# Patient Record
Sex: Male | Born: 1970 | Race: White | Hispanic: No | Marital: Married | Smoking: Former smoker
Health system: Southern US, Community
[De-identification: ages and names within clinical notes are randomized; demographics above are authoritative.]

## PROBLEM LIST (undated history)

## (undated) DIAGNOSIS — D126 Benign neoplasm of colon, unspecified: Secondary | ICD-10-CM

## (undated) DIAGNOSIS — R112 Nausea with vomiting, unspecified: Secondary | ICD-10-CM

## (undated) DIAGNOSIS — M5430 Sciatica, unspecified side: Secondary | ICD-10-CM

## (undated) DIAGNOSIS — K219 Gastro-esophageal reflux disease without esophagitis: Secondary | ICD-10-CM

## (undated) DIAGNOSIS — T8859XA Other complications of anesthesia, initial encounter: Secondary | ICD-10-CM

## (undated) DIAGNOSIS — J189 Pneumonia, unspecified organism: Secondary | ICD-10-CM

## (undated) DIAGNOSIS — Z9889 Other specified postprocedural states: Secondary | ICD-10-CM

## (undated) DIAGNOSIS — J45909 Unspecified asthma, uncomplicated: Secondary | ICD-10-CM

## (undated) DIAGNOSIS — A770 Spotted fever due to Rickettsia rickettsii: Secondary | ICD-10-CM

## (undated) DIAGNOSIS — T4145XA Adverse effect of unspecified anesthetic, initial encounter: Secondary | ICD-10-CM

## (undated) HISTORY — DX: Gastro-esophageal reflux disease without esophagitis: K21.9

## (undated) HISTORY — DX: Benign neoplasm of colon, unspecified: D12.6

## (undated) HISTORY — DX: Spotted fever due to Rickettsia rickettsii: A77.0

## (undated) HISTORY — DX: Sciatica, unspecified side: M54.30

---
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
## 2006-10-24 DIAGNOSIS — A770 Spotted fever due to Rickettsia rickettsii: Secondary | ICD-10-CM

## 2006-10-24 HISTORY — DX: Spotted fever due to Rickettsia rickettsii: A77.0

## 2008-03-21 ENCOUNTER — Ambulatory Visit (HOSPITAL_COMMUNITY): Admission: RE | Admit: 2008-03-21 | Discharge: 2008-03-21 | Payer: Self-pay | Admitting: Nurse Practitioner

## 2011-12-21 ENCOUNTER — Ambulatory Visit: Payer: Self-pay | Admitting: Nurse Practitioner

## 2012-07-19 ENCOUNTER — Emergency Department: Payer: Self-pay | Admitting: Emergency Medicine

## 2012-07-19 LAB — COMPREHENSIVE METABOLIC PANEL
AST: 20 U/L
Albumin: 3.9
Alkaline Phosphatase: 59 U/L (ref 50–136)
Anion gap: 10
BUN: 10 mg/dL (ref 7–18)
Bilirubin,Total: 0.9 mg/dL (ref 0.2–1.0)
CO2: 25 mmol/L
Calcium: 9.5 mg/dL
Chloride: 108 mmol/L — ABNORMAL HIGH (ref 98–107)
Chloride: 108 mmol/L
Co2: 25 mmol/L (ref 21–32)
Creatinine: 0.95 mg/dL (ref 0.60–1.30)
EGFR (Non-African Amer.): >60
Lipase: 211 units/L — AB (ref 0–53)
Osmolality: 284
Potassium: 4 mmol/L
SGOT(AST): 20 U/L (ref 15–37)
SGPT (ALT): 36 U/L (ref 12–78)
Sodium: 143 mmol/L (ref 137–147)

## 2012-07-19 LAB — CBC WITH DIFFERENTIAL/PLATELET
HCT: 46 %
Hemoglobin: 15.6 g/dL (ref 13.5–17.5)
MCH: 30.3
MCHC: 33.9
MCV: 90 fL
RBC: 5.15

## 2012-07-19 LAB — URINALYSIS, COMPLETE
Blood: NEGATIVE
Leukocyte Esterase: NEGATIVE
Nitrite: NEGATIVE
Ph: 5 (ref 4.5–8.0)
Protein: NEGATIVE

## 2012-07-19 LAB — CBC
HCT: 46.1 % (ref 40.0–52.0)
MCHC: 33.9 g/dL (ref 32.0–36.0)
Platelet: 380 10*3/uL (ref 150–440)
RDW: 13.3 % (ref 11.5–14.5)
WBC: 8.7 10*3/uL (ref 3.8–10.6)

## 2012-07-19 LAB — LIPASE, BLOOD: Lipase: 211 U/L (ref 73–393)

## 2012-07-24 ENCOUNTER — Encounter (HOSPITAL_COMMUNITY): Payer: Self-pay

## 2012-07-24 ENCOUNTER — Encounter: Payer: Self-pay | Admitting: Internal Medicine

## 2012-07-24 ENCOUNTER — Telehealth: Payer: Self-pay | Admitting: Internal Medicine

## 2012-07-24 ENCOUNTER — Ambulatory Visit (HOSPITAL_COMMUNITY)
Admission: RE | Admit: 2012-07-24 | Discharge: 2012-07-24 | Disposition: A | Discharge status: Home / Self Care | Payer: BC Managed Care – PPO | Source: Ambulatory Visit | Attending: Urgent Care | Admitting: Urgent Care

## 2012-07-24 ENCOUNTER — Ambulatory Visit (INDEPENDENT_AMBULATORY_CARE_PROVIDER_SITE_OTHER): Payer: BC Managed Care – PPO | Admitting: Urgent Care

## 2012-07-24 ENCOUNTER — Encounter: Payer: Self-pay | Admitting: Urgent Care

## 2012-07-24 ENCOUNTER — Other Ambulatory Visit: Payer: Self-pay | Admitting: Internal Medicine

## 2012-07-24 VITALS — BP 120/74 | HR 64 | Temp 97.5°F | Ht 74.0 in | Wt 254.2 lb

## 2012-07-24 DIAGNOSIS — R109 Unspecified abdominal pain: Secondary | ICD-10-CM | POA: Insufficient documentation

## 2012-07-24 DIAGNOSIS — K219 Gastro-esophageal reflux disease without esophagitis: Secondary | ICD-10-CM | POA: Insufficient documentation

## 2012-07-24 DIAGNOSIS — R197 Diarrhea, unspecified: Secondary | ICD-10-CM

## 2012-07-24 DIAGNOSIS — R933 Abnormal findings on diagnostic imaging of other parts of digestive tract: Secondary | ICD-10-CM | POA: Insufficient documentation

## 2012-07-24 DIAGNOSIS — R1010 Upper abdominal pain, unspecified: Secondary | ICD-10-CM

## 2012-07-24 MED ORDER — IOHEXOL 300 MG/ML  SOLN
100.0000 mL | Freq: Once | INTRAMUSCULAR | Status: AC | PRN
  Administered 2012-07-24: 100 mL via INTRAVENOUS

## 2012-07-24 MED ORDER — PEG 3350-KCL-NA BICARB-NACL 420 G PO SOLR: 4000.0000 mL | ORAL | Status: DC

## 2012-07-24 NOTE — Progress Notes (Signed)
Faxed to PCP

## 2012-07-24 NOTE — Telephone Encounter (Signed)
Pt called to let us know he has just left [facility] from having his CT done and was told not to eat or drink anything until he hears back from [place]. His cell phone ringer is broken and he said he would call back at 4 to see if Doristine Church has heard from the radiologist. Pt was advised to stay in [place] area for the next hour or so until he got further instructions.

## 2012-07-24 NOTE — Telephone Encounter (Signed)
Route to [employer]

## 2012-07-24 NOTE — Progress Notes (Signed)
Pt was given stool containers and all lab orders when he came to the office.

## 2012-07-24 NOTE — Addendum Note (Signed)
Addended by: Joselyn Arrow on: 07/24/2012 04:11 PM   Modules accepted: Orders

## 2012-07-24 NOTE — Progress Notes (Signed)
Referring Provider: No ref. provider found Primary Care Physician:  PATTERSON, [name], NP Primary Gastroenterologist:  Dr. Rourk  Chief Complaint  Patient presents with  . Abdominal Pain    x 3.5 weeks.    HPI:  Shawn Brandt is a 41 y.o. male here as a referral from [name] Patterson, NP for abdominal pain.  Approximately 4 weeks ago, pt began to have severe abdominal pain that started in upper abdomen.  Later pain moved to LLQ.  He has daily discomfort when sitting.  Pain improves with belching or flatus.  He went to ER at [facility] & was sent home with relief after GI cocktail.   He recalls intermittent episodes of abdominal pain over past 2yrs.  The pain radiates to his back between his shoulder blades.  He describes it as sharp.  Usually occurs at 1am.  Pain always occurs at night after 10:00. It has been waking him from sleeping. Episodes last around 3-1/2 hours. Pain 8/10 at worst.  Pain 2/10 now.  Pain worse after 5-10 min after eating.  Denies vomiting.  C/o heartburn & indigestion.  He has been taking Omeprazole 20mg daily intermittently for the last 2 yrs.  He admits to stopping when he was feeling better.  He has been taking Omeprazole 20mg on a regular basis for the past 3 months.  Denies dysphagia or odynophagia.  Wt stable. He describes a lifelong history of "loose stools" approximately 3-4 daily, however the last several weeks he has been having 5-6 stools daily. Denies rectal bleeding, melena, or mucus in his stools.   9/26 ER Labs reviewed:  Hemoglobin,  WBC, platelets normal. CMP normal except chloride 108. Lipase normal. Urinalysis shows mucous, otherwise normal. Abdominal ultrasound 12/21/11 normal  Past Medical History  Diagnosis Date  . GERD (gastroesophageal reflux disease)   . 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  Current Outpatient Prescriptions  Medication Sig Dispense Refill  . calcium carbonate (TUMS - DOSED IN MG  ELEMENTAL CALCIUM) 500 MG chewable tablet Chew 1 tablet by mouth as needed.      . omeprazole (PRILOSEC) 20 MG capsule Take 20 mg by mouth daily.        Allergies as of 07/24/2012 - Review Complete 07/24/2012  Allergen Reaction Noted  . Peanuts (peanut oil)  07/24/2012  . Penicillins  07/24/2012    Family History:There is no known family history of colorectal carcinoma , liver disease, or inflammatory bowel disease.  Problem Relation Age of Onset  . Cholelithiasis Mother   . Cancer Maternal Grandfather     ?  . Breast cancer Mother   . Prostate cancer Father     History   Social History  . Marital Status: Married    Spouse Name: N/A    Number of Children: 2  . Years of Education: N/A   Occupational History  . probation officer    Social History Main Topics  . Smoking status: Never Smoker   . Smokeless tobacco: Current User  . Alcohol Use: Yes     3 times per yr, 2 drinks  . Drug Use: No  . Sexually Active: Not on file   Other Topics Concern  . Not on file   Social History Narrative  . No narrative on file   Review of Systems: Gen: Denies any fever, chills, sweats, anorexia, fatigue, weakness, malaise, weight loss, and sleep disorder CV: Denies chest pain, angina, palpitations, syncope, orthopnea, PND,   peripheral edema, and claudication. Resp: Denies dyspnea at rest, dyspnea with exercise, cough, sputum, wheezing, coughing up blood, and pleurisy. GI: Denies vomiting blood, jaundice, and fecal incontinence.   GU : Denies urinary burning, blood in urine, urinary frequency, urinary hesitancy, nocturnal urination, and urinary incontinence. MS: Denies joint pain, limitation of movement, and swelling, stiffness, low back pain, extremity pain. Denies muscle weakness, cramps, atrophy.  Derm: Denies rash, itching, dry skin, hives, moles, warts, or unhealing ulcers.  Psych: Denies depression, anxiety, memory loss, suicidal ideation, hallucinations, paranoia, and  confusion. Heme: Denies bruising, bleeding, and enlarged lymph nodes. Neuro:  Denies any headaches, dizziness, paresthesias. Endo:  Denies any problems with DM, thyroid, adrenal function.  Physical Exam: BP 120/74  Pulse 64  Temp 97.5 F (36.4 C) (Temporal)  Ht 6' 2" (1.88 m)  Wt 254 lb 3.2 oz (115.304 kg)  BMI 32.64 kg/m2 No LMP for male patient. General:   Alert,  Well-developed, obese, pleasant and cooperative in NAD Head:  Normocephalic and atraumatic. Eyes:  Sclera clear, no icterus.   Conjunctiva pink. Ears:  Normal auditory acuity. Nose:  No deformity, discharge, or lesions. Mouth:  No deformity or lesions,oropharynx pink & moist. Neck:  Supple; no masses or thyromegaly. Lungs:  Clear throughout to auscultation.   No wheezes, crackles, or rhonchi. No acute distress. Heart:  Regular rate and rhythm; no murmurs, clicks, rubs,  or gallops. Abdomen:  Protuberant. Normal bowel sounds.  No bruits.  Soft, non-distended.  Moderate tenderness to palpation of upper abdomen as well around umbilicus and left lower quadrant. No masses, hepatosplenomegaly or hernias noted.  No guarding or rebound tenderness.   Rectal:  Deferred. Msk:  Symmetrical without gross deformities. Normal posture. Pulses:  Normal pulses noted. Extremities:  No clubbing or edema. Neurologic:  Alert and oriented x4;  grossly normal neurologically. Skin:  Intact without significant lesions or rashes. Lymph Nodes:  No significant cervical adenopathy. Psych:  Alert and cooperative. Normal mood and affect.  

## 2012-07-24 NOTE — Assessment & Plan Note (Addendum)
Shawn Brandt is a pleasant 41 y.o. male with severe abdominal pain for the past 3-4 weeks. The majority of his pain his upper abdomen & it does radiate to his back between his shoulder blades as well as left lower quadrant.  Ultrasound in February of abdomen was normal.  Labs from recent ER visit were normal. Differentials are broad & include poorly controlled GERD, gastritis, peptic ulcer disease, pancreatitis, cholecystitis, diverticulitis, AAA or colitis.  STAT CT Abd/pelvis w/ IV/oral contrast  To ER if severe pain

## 2012-07-24 NOTE — Patient Instructions (Addendum)
We will call you with CT results We have requested your records from [facility] ER Stop Omeprazole Start Dexilant 60mg  daily  To ER if severe pain

## 2012-07-24 NOTE — Telephone Encounter (Signed)
noted 

## 2012-07-24 NOTE — Progress Notes (Signed)
Ct shows nonspecific minimal thickening of the distal terminal little wall and nonspecific mild thickening of the urinary bladder wall. Results reviewed with patient. Full set of stool studies, urinalysis with reflex culture. Supportive measures including increased fluids discussed with patient. Colonoscopy and EGD with Dr. Jena Gauss reason : Diarrhea, thickened TI on CT, upper abdominal pain, GERD Patient planning on stopping by to pick up lab orders, containers, and schedule procedures today

## 2012-07-24 NOTE — Assessment & Plan Note (Signed)
Intermittent GERD, now with chronic daily symptoms & dyspepsia.    Stop Omeprazole Start Dexilant 60mg  daily  Consider EGD w/ Dr Jena Gauss if CT does not explain pain.  I have discussed risks & benefits which include, but are not limited to, bleeding, infection, perforation & drug reaction.  The patient agrees with this plan & written consent will be obtained.

## 2012-07-24 NOTE — Progress Notes (Signed)
Quick Note:  See addendum to OV. Pt will have stools, UA, TCS/EGD w/ Dr Jena Gauss. YN:[id number], [name], NP  ______

## 2012-07-24 NOTE — Assessment & Plan Note (Signed)
Chronic loose stools as baseline w/ recent increased frequency 5-6 nonbloody stools per day.  Differentials include colitis, IBD, or IBS.

## 2012-07-24 NOTE — Progress Notes (Signed)
Patient is scheduled for TCS and EGD with RMR on Oct 17th and he was given instructions

## 2012-07-25 LAB — URINALYSIS, ROUTINE W REFLEX MICROSCOPIC
Bilirubin Urine: NEGATIVE
Glucose, UA: NEGATIVE mg/dL
Hgb urine dipstick: NEGATIVE
Ketones, ur: NEGATIVE mg/dL
Nitrite: NEGATIVE
Specific Gravity, Urine: 1.02 (ref 1.005–1.030)
pH: 6 (ref 5.0–8.0)

## 2012-07-25 NOTE — Progress Notes (Signed)
Quick Note:  Await stools WU:[id number], [name], NP  ______

## 2012-07-25 NOTE — Progress Notes (Signed)
Faxed to PCP

## 2012-07-26 LAB — CLOSTRIDIUM DIFFICILE BY PCR: Toxigenic C. Difficile by PCR: NOT DETECTED

## 2012-07-26 LAB — GIARDIA/CRYPTOSPORIDIUM (EIA)
Cryptosporidium Screen (EIA): NEGATIVE
Giardia Screen (EIA): NEGATIVE

## 2012-07-26 NOTE — Progress Notes (Signed)
Quick Note:  Reviewed at OV. LIPASE WNL per [facility] parameters. ______

## 2012-07-27 ENCOUNTER — Telehealth: Payer: Self-pay

## 2012-07-27 NOTE — Telephone Encounter (Signed)
I called Pt back and told him that there was no need for any anti-bx. He said that hopefull the TCS will show something to help him. His pain is all over.

## 2012-07-27 NOTE — Progress Notes (Signed)
Quick Note:  Cc:PATTERSON, [name], NP  ______ 

## 2012-07-27 NOTE — Telephone Encounter (Signed)
Spoke w/ pt. Today is much better than yesterday. C/o intermittent 10 sec "flash pain" & tenderness. Pain 3/10.  C/o lower back pain.  No fever or diarrhea or vomiting. Offered pain meds, pt declined.  States he is sleeping fine.  Does not need. Advised if severe pain over weekend, to ER. Pt advised to FU with Ninfa Linden, NP regarding bladder thickening on CT.  NO UTI on urinalysis. Keep procedures as planned.

## 2012-07-27 NOTE — Telephone Encounter (Signed)
Pt called this morning to see if the results from the stool studies were by. I told him I would look to see ans call him back at [phone]. I am calling [facility] to find out about the stool culture. Please advise.

## 2012-07-29 LAB — STOOL CULTURE

## 2012-07-30 NOTE — Progress Notes (Signed)
Faxed to [name] Patterson 

## 2012-08-06 ENCOUNTER — Encounter (HOSPITAL_COMMUNITY): Payer: Self-pay | Admitting: Pharmacy Technician

## 2012-08-08 MED ORDER — SODIUM CHLORIDE 0.45 % IV SOLN
INTRAVENOUS | Status: DC
  Administered 2012-08-09: 12:00:00 via INTRAVENOUS

## 2012-08-09 ENCOUNTER — Encounter (HOSPITAL_COMMUNITY): Payer: Self-pay | Admitting: *Deleted

## 2012-08-09 ENCOUNTER — Other Ambulatory Visit: Payer: Self-pay

## 2012-08-09 ENCOUNTER — Ambulatory Visit (HOSPITAL_COMMUNITY)
Admission: RE | Admit: 2012-08-09 | Discharge: 2012-08-09 | Disposition: A | Discharge status: Home / Self Care | Payer: BC Managed Care – PPO | Source: Ambulatory Visit | Attending: Internal Medicine | Admitting: Internal Medicine

## 2012-08-09 ENCOUNTER — Encounter (HOSPITAL_COMMUNITY)
Admission: RE | Disposition: A | Discharge status: Home / Self Care | Payer: Self-pay | Source: Ambulatory Visit | Attending: Internal Medicine

## 2012-08-09 DIAGNOSIS — D126 Benign neoplasm of colon, unspecified: Secondary | ICD-10-CM

## 2012-08-09 DIAGNOSIS — R109 Unspecified abdominal pain: Secondary | ICD-10-CM | POA: Insufficient documentation

## 2012-08-09 DIAGNOSIS — R197 Diarrhea, unspecified: Secondary | ICD-10-CM | POA: Insufficient documentation

## 2012-08-09 DIAGNOSIS — R1010 Upper abdominal pain, unspecified: Secondary | ICD-10-CM

## 2012-08-09 DIAGNOSIS — R933 Abnormal findings on diagnostic imaging of other parts of digestive tract: Secondary | ICD-10-CM

## 2012-08-09 DIAGNOSIS — R1013 Epigastric pain: Secondary | ICD-10-CM

## 2012-08-09 DIAGNOSIS — K2100 Gastro-esophageal reflux disease with esophagitis, without bleeding: Secondary | ICD-10-CM | POA: Insufficient documentation

## 2012-08-09 DIAGNOSIS — K219 Gastro-esophageal reflux disease without esophagitis: Secondary | ICD-10-CM

## 2012-08-09 HISTORY — PX: ESOPHAGOGASTRODUODENOSCOPY: SHX1529

## 2012-08-09 HISTORY — PX: COLONOSCOPY: SHX174

## 2012-08-09 SURGERY — COLONOSCOPY WITH ESOPHAGOGASTRODUODENOSCOPY (EGD)
Anesthesia: Moderate Sedation

## 2012-08-09 MED ORDER — MEPERIDINE HCL 100 MG/ML IJ SOLN
INTRAMUSCULAR | Status: DC | PRN
  Administered 2012-08-09: 50 mg via INTRAVENOUS
  Administered 2012-08-09 (×2): 25 mg via INTRAVENOUS
  Administered 2012-08-09: 50 mg via INTRAVENOUS
  Administered 2012-08-09: 25 mg via INTRAVENOUS
  Administered 2012-08-09: 50 mg via INTRAVENOUS

## 2012-08-09 MED ORDER — MEPERIDINE HCL 100 MG/ML IJ SOLN
INTRAMUSCULAR | Status: AC
  Filled 2012-08-09: qty 2

## 2012-08-09 MED ORDER — STERILE WATER FOR IRRIGATION IR SOLN
Status: DC | PRN
  Administered 2012-08-09: 13:00:00

## 2012-08-09 MED ORDER — MIDAZOLAM HCL 5 MG/5ML IJ SOLN
INTRAMUSCULAR | Status: DC | PRN
  Administered 2012-08-09 (×2): 1 mg via INTRAVENOUS
  Administered 2012-08-09: 2 mg via INTRAVENOUS
  Administered 2012-08-09 (×2): 1 mg via INTRAVENOUS
  Administered 2012-08-09 (×2): 2 mg via INTRAVENOUS

## 2012-08-09 MED ORDER — DEXLANSOPRAZOLE 60 MG PO CPDR: 60.0000 mg | DELAYED_RELEASE_CAPSULE | Freq: Every day | ORAL | Status: DC

## 2012-08-09 MED ORDER — MEPERIDINE HCL 100 MG/ML IJ SOLN
INTRAMUSCULAR | Status: AC
  Filled 2012-08-09: qty 1

## 2012-08-09 MED ORDER — BUTAMBEN-TETRACAINE-BENZOCAINE 2-2-14 % EX AERO
INHALATION_SPRAY | CUTANEOUS | Status: DC | PRN
  Administered 2012-08-09: 2 via TOPICAL

## 2012-08-09 MED ORDER — MIDAZOLAM HCL 5 MG/5ML IJ SOLN
INTRAMUSCULAR | Status: AC
  Filled 2012-08-09: qty 10

## 2012-08-09 NOTE — H&P (View-Only) (Signed)
Referring Provider: No ref. provider found Primary Care Physician:  Ninfa Linden, NP Primary Gastroenterologist:  Dr. Jena Gauss  Chief Complaint  Patient presents with  . Abdominal Pain    x 3.5 weeks.    HPI:  Shawn Brandt is a 41 y.o. male here as a referral from Ninfa Linden, NP for abdominal pain.  Approximately 4 weeks ago, pt began to have severe abdominal pain that started in upper abdomen.  Later pain moved to LLQ.  He has daily discomfort when sitting.  Pain improves with belching or flatus.  He went to ER at [facility] & was sent home with relief after GI cocktail.   He recalls intermittent episodes of abdominal pain over past 66yrs.  The pain radiates to his back between his shoulder blades.  He describes it as sharp.  Usually occurs at 1am.  Pain always occurs at night after 10:00. It has been waking him from sleeping. Episodes last around 3-1/2 hours. Pain 8/10 at worst.  Pain 2/10 now.  Pain worse after 5-10 min after eating.  Denies vomiting.  C/o heartburn & indigestion.  He has been taking Omeprazole 20mg  daily intermittently for the last 2 yrs.  He admits to stopping when he was feeling better.  He has been taking Omeprazole 20mg  on a regular basis for the past 3 months.  Denies dysphagia or odynophagia.  Wt stable. He describes a lifelong history of "loose stools" approximately 3-4 daily, however the last several weeks he has been having 5-6 stools daily. Denies rectal bleeding, melena, or mucus in his stools.   9/26 ER Labs reviewed:  Hemoglobin,  WBC, platelets normal. CMP normal except chloride 108. Lipase normal. Urinalysis shows mucous, otherwise normal. Abdominal ultrasound 12/21/11 normal  Past Medical History  Diagnosis Date  . GERD (gastroesophageal reflux disease)   . RMSF [facility] spotted fever) 2008    No past surgical history on file.  Current Outpatient Prescriptions  Medication Sig Dispense Refill  . calcium carbonate (TUMS - DOSED IN MG  ELEMENTAL CALCIUM) 500 MG chewable tablet Chew 1 tablet by mouth as needed.      Marland Kitchen omeprazole (PRILOSEC) 20 MG capsule Take 20 mg by mouth daily.        Allergies as of 07/24/2012 - Review Complete 07/24/2012  Allergen Reaction Noted  . Peanuts (peanut oil)  07/24/2012  . Penicillins  07/24/2012    Family History:There is no known family history of colorectal carcinoma , liver disease, or inflammatory bowel disease.  Problem Relation Age of Onset  . Cholelithiasis Mother   . Cancer Maternal Grandfather     ?  Marland Kitchen Breast cancer Mother   . Prostate cancer Father     History   Social History  . Marital Status: Married    Spouse Name: N/A    Number of Children: 2  . Years of Education: N/A   Occupational History  . probation officer    Social History Main Topics  . Smoking status: Never Smoker   . Smokeless tobacco: Current User  . Alcohol Use: Yes     3 times per yr, 2 drinks  . Drug Use: No  . Sexually Active: Not on file   Other Topics Concern  . Not on file   Social History Narrative  . No narrative on file   Review of Systems: Gen: Denies any fever, chills, sweats, anorexia, fatigue, weakness, malaise, weight loss, and sleep disorder CV: Denies chest pain, angina, palpitations, syncope, orthopnea, PND,  peripheral edema, and claudication. Resp: Denies dyspnea at rest, dyspnea with exercise, cough, sputum, wheezing, coughing up blood, and pleurisy. GI: Denies vomiting blood, jaundice, and fecal incontinence.   GU : Denies urinary burning, blood in urine, urinary frequency, urinary hesitancy, nocturnal urination, and urinary incontinence. MS: Denies joint pain, limitation of movement, and swelling, stiffness, low back pain, extremity pain. Denies muscle weakness, cramps, atrophy.  Derm: Denies rash, itching, dry skin, hives, moles, warts, or unhealing ulcers.  Psych: Denies depression, anxiety, memory loss, suicidal ideation, hallucinations, paranoia, and  confusion. Heme: Denies bruising, bleeding, and enlarged lymph nodes. Neuro:  Denies any headaches, dizziness, paresthesias. Endo:  Denies any problems with DM, thyroid, adrenal function.  Physical Exam: BP 120/74  Pulse 64  Temp 97.5 F (36.4 C) (Temporal)  Ht 6\' 2"  (1.88 m)  Wt 254 lb 3.2 oz (115.304 kg)  BMI 32.64 kg/m2 No LMP for male patient. General:   Alert,  Well-developed, obese, pleasant and cooperative in NAD Head:  Normocephalic and atraumatic. Eyes:  Sclera clear, no icterus.   Conjunctiva pink. Ears:  Normal auditory acuity. Nose:  No deformity, discharge, or lesions. Mouth:  No deformity or lesions,oropharynx pink & moist. Neck:  Supple; no masses or thyromegaly. Lungs:  Clear throughout to auscultation.   No wheezes, crackles, or rhonchi. No acute distress. Heart:  Regular rate and rhythm; no murmurs, clicks, rubs,  or gallops. Abdomen:  Protuberant. Normal bowel sounds.  No bruits.  Soft, non-distended.  Moderate tenderness to palpation of upper abdomen as well around umbilicus and left lower quadrant. No masses, hepatosplenomegaly or hernias noted.  No guarding or rebound tenderness.   Rectal:  Deferred. Msk:  Symmetrical without gross deformities. Normal posture. Pulses:  Normal pulses noted. Extremities:  No clubbing or edema. Neurologic:  Alert and oriented x4;  grossly normal neurologically. Skin:  Intact without significant lesions or rashes. Lymph Nodes:  No significant cervical adenopathy. Psych:  Alert and cooperative. Normal mood and affect.

## 2012-08-09 NOTE — Op Note (Signed)
NAME:  Shawn Brandt, Shawn Brandt             ACCOUNT NO.:  [id number]  MEDICAL RECORD NO.:  [id number]  LOCATION:  [facility]                          FACILITY:  [facility]  PHYSICIAN:  R. Roetta Sessions, MD FACP FACGDATE OF BIRTH:  [birth year]  DATE OF PROCEDURE:  08/09/2012 DATE OF DISCHARGE:  08/09/2012                              OPERATIVE REPORT   PROCEDURE:  Ileal colonoscopy, snare polypectomy, gallstone induced injury.  INDICATIONS FOR PROCEDURE:  A 41 year old gentleman with recent abdominal pain and tendency towards diarrhea (bladder symptoms has improved).  Recent CT demonstrated thickening of the terminal ileum. The ileal colonoscopy is now being done.  Risks, benefits, limitations, alternatives, imponderables have been discussed, questions were answered.  Please see the documentation of medical record or procedure note.  O2 saturation, blood pressure, pulse, respirations monitored throughout the entire procedure.  Conscious sedation Versed 2 mg IV, Demerol 225 mg IV in divided doses.  INSTRUMENT:  Pentax video chip system.  FINDINGS:  Digital rectal exam revealed no abnormalities. Scaphoid prep was adequate.  Colon:  Colonic mucosa surveyed from the rectosigmoid junction through the left, transverse, right colon, to the appendiceal orifice, ileocecal valve/cecum.  These structures well seen photographed the record.  Terminal ileum was intubated 10 cm.  From this level, scope was slowly and cautiously withdrawn.  All previously mentioned mucosal surfaces were again seen.  The patient was noted to have a 3 mm x 8 mm, sausage-shaped sessile polyp in the base of the cecum.  The remainder of the colonic mucosa appeared normal.  The terminal ileal mucosa also appeared normal.  The polyp in the cecum was hot snare removed.  The scope was pulled down in the rectum where thorough exam of the rectal mucosa including retroflexed view of the anal verge demonstrated no abnormalities.  The patient  tolerated the procedure well and was reactive to endoscopy.  Cecal withdrawal time 8 minutes.  IMPRESSION: 1. Normal rectum. 2. Cecal polyp status post hot snare polypectomy. 3. Normal terminal ileal mucosa.  RECOMMENDATIONS: 1. See EGD report. 2. Follow up on pathology. 3. Further recommendations to follow.     Jonathon Bellows, MD [facility]     RMR/MEDQ  D:  08/09/2012  T:  08/09/2012  Job:  [id number]  cc:   Ninfa Linden, FNP Fax: [phone]

## 2012-08-09 NOTE — H&P (View-Only) (Signed)
Quick Note:  [id number], [name], NP  ______

## 2012-08-09 NOTE — Op Note (Signed)
[facility] [address] [place], [address]   ENDOSCOPY PROCEDURE REPORT  PATIENT: Shawn Brandt, Shawn Brandt  MR#: [id number] BIRTHDATE: Nov 26, 1970 , 41  yrs. old GENDER: Male ENDOSCOPIST: R.  Roetta Sessions, MD FACP [facility] REFERRED BY:  Ninfa Linden, NP PROCEDURE DATE:  08/09/2012 PROCEDURE:    Diagnostic EGD   INDICATIONS:   Epigastric pain/GERD - symptoms recently significantly improved with a course of Dexilant.  INFORMED CONSENT:   The risks, benefits, limitations, alternatives and imponderables have been discussed.  The potential for biopsy, esophogeal dilation, etc. have also been reviewed.  Questions have been answered.  All parties agreeable.  Please see the history and physical in the medical record for more information.  MEDICATIONS:   Versed 6 mg IV and Demerol 150 mg IV in divided doses. Cetacaine spray.  DESCRIPTION OF PROCEDURE:   The EG-[id number] ([id number]) and EC-[id number] ([id number])  endoscope was introduced through the mouth and advanced to the second portion of the duodenum without difficulty or limitations.  The mucosal surfaces were surveyed very carefully during advancement of the scope and upon withdrawal.  Retroflexion view of the proximal stomach and esophagogastric junction was performed.      FINDINGS: Tiny distal esophageal erosions within 3 mm of the GE junction. No Barrett's esophagus or other abnormality. Stomach empty. Gastric mucosa appeared normal. Patent pylorus. Normal first and second portion of the duodenum  THERAPEUTIC / DIAGNOSTIC MANEUVERS PERFORMED:  none   COMPLICATIONS:  None  IMPRESSION:  Mild erosive reflux esophagitis  RECOMMENDATIONS:  Continue Dexilant 60 mg orally daily. See colonoscopy report.    _______________________________ R. Roetta Sessions, MD FACP [facility] eSigned:  R. Roetta Sessions, MD FACP [facility] 08/09/2012 1:30 PM     CC:

## 2012-08-09 NOTE — Interval H&P Note (Signed)
History and Physical Interval Note:  08/09/2012 12:59 PM  Shawn Brandt  has presented today for surgery, with the diagnosis of Diarrhea, Thickened TI on CT and Upper Abdominal Pain  The various methods of treatment have been discussed with the patient and family. After consideration of risks, benefits and other options for treatment, the patient has consented to  Procedure(s) (LRB) with comments: COLONOSCOPY WITH ESOPHAGOGASTRODUODENOSCOPY (EGD) (N/A) - 12:15 as a surgical intervention .  The patient's history has been reviewed, patient examined, no change in status, stable for surgery.  I have reviewed the patient's chart and labs.  Questions were answered to the patient's satisfaction.     Kesi Perrow  Stool studies negative. Epigastric pain significantly improved with a course of Dexilant. However, it has not completely subsided. Plan per consultation note.  The risks, benefits, limitations, imponderables and alternatives regarding both EGD and colonoscopy have been reviewed with the patient. Questions have been answered. All parties agreeable.

## 2012-08-09 NOTE — Op Note (Signed)
[facility] [address] [place], [address]   ENDOSCOPY PROCEDURE REPORT  PATIENT: Shawn Brandt, Shawn Brandt  MR#: [id number] BIRTHDATE: 06/18/1971 , 41  yrs. old GENDER: Male ENDOSCOPIST: R.  Roetta Sessions, MD [facility] REFERRED BY: PROCEDURE DATE:  08/09/2012 PROCEDURE: ASA CLASS:     [Referring Physician] INDICATIONS: MEDICATIONS: TOPICAL ANESTHETIC:  DESCRIPTION OF PROCEDURE: After the risks benefits and alternatives of the procedure were thoroughly explained, informed consent was obtained.  The EG-2990i ([id number]) and EC-[id number] ([id number]) endoscope was introduced through the mouth and advanced to the   . Without limitations.  The instrument was slowly withdrawn as the mucosa was fully examined.    The scope was then withdrawn from the patient and the procedure completed.  COMPLICATIONS: There were no complications. ENDOSCOPIC IMPRESSION:  RECOMMENDATIONS:  REPEAT EXAM:  eSigned:  R. Roetta Sessions, MD FACP [facility] 08/09/2012 3:07 PM   CC:

## 2012-08-09 NOTE — Interval H&P Note (Signed)
History and Physical Interval Note:  08/09/2012 12:58 PM  Shawn Brandt  has presented today for surgery, with the diagnosis of Diarrhea, Thickened TI on CT and Upper Abdominal Pain  The various methods of treatment have been discussed with the patient and family. After consideration of risks, benefits and other options for treatment, the patient has consented to  Procedure(s) (LRB) with comments: COLONOSCOPY WITH ESOPHAGOGASTRODUODENOSCOPY (EGD) (N/A) - 12:15 as a surgical intervention .  The patient's history has been reviewed, patient examined, no change in status, stable for surgery.  I have reviewed the patient's chart and labs.  Questions were answered to the patient's satisfaction.     [name]

## 2012-08-15 ENCOUNTER — Ambulatory Visit (HOSPITAL_COMMUNITY): Payer: BC Managed Care – PPO

## 2012-08-15 ENCOUNTER — Ambulatory Visit (INDEPENDENT_AMBULATORY_CARE_PROVIDER_SITE_OTHER): Payer: BC Managed Care – PPO | Admitting: Urgent Care

## 2012-08-15 ENCOUNTER — Telehealth: Payer: Self-pay | Admitting: Urgent Care

## 2012-08-15 ENCOUNTER — Encounter: Payer: Self-pay | Admitting: Urgent Care

## 2012-08-15 ENCOUNTER — Other Ambulatory Visit (HOSPITAL_COMMUNITY): Payer: Self-pay | Admitting: *Deleted

## 2012-08-15 ENCOUNTER — Ambulatory Visit (HOSPITAL_COMMUNITY)
Admission: RE | Admit: 2012-08-15 | Discharge: 2012-08-15 | Disposition: A | Discharge status: Home / Self Care | Payer: BC Managed Care – PPO | Source: Ambulatory Visit | Attending: Urgent Care | Admitting: Urgent Care

## 2012-08-15 VITALS — BP 122/76 | HR 71 | Temp 97.4°F | Ht 74.0 in | Wt 244.0 lb

## 2012-08-15 DIAGNOSIS — M542 Cervicalgia: Secondary | ICD-10-CM | POA: Insufficient documentation

## 2012-08-15 DIAGNOSIS — R1011 Right upper quadrant pain: Secondary | ICD-10-CM

## 2012-08-15 DIAGNOSIS — R079 Chest pain, unspecified: Secondary | ICD-10-CM

## 2012-08-15 DIAGNOSIS — K219 Gastro-esophageal reflux disease without esophagitis: Secondary | ICD-10-CM

## 2012-08-15 DIAGNOSIS — D126 Benign neoplasm of colon, unspecified: Secondary | ICD-10-CM

## 2012-08-15 DIAGNOSIS — R0789 Other chest pain: Secondary | ICD-10-CM

## 2012-08-15 DIAGNOSIS — R109 Unspecified abdominal pain: Secondary | ICD-10-CM

## 2012-08-15 DIAGNOSIS — M47812 Spondylosis without myelopathy or radiculopathy, cervical region: Secondary | ICD-10-CM | POA: Insufficient documentation

## 2012-08-15 MED ORDER — IOHEXOL 300 MG/ML  SOLN
80.0000 mL | Freq: Once | INTRAMUSCULAR | Status: AC | PRN
  Administered 2012-08-15: 80 mL via INTRAVENOUS

## 2012-08-15 MED ORDER — TRAMADOL HCL 50 MG PO TABS: 50.0000 mg | ORAL_TABLET | Freq: Two times a day (BID) | ORAL | Status: DC

## 2012-08-15 NOTE — Progress Notes (Signed)
Referring Provider: Jeni Salles, FNP Primary Care Physician:  Ninfa Linden, FNP Primary Gastroenterologist:  Dr. Jena Gauss  Chief Complaint  Patient presents with  . Pain    neck/chest pain    HPI:  Shawn Brandt is a 41 y.o. male here for urgent work- in for neck pain & chest pain s/p EGD & colonoscopy with polypectomy 6 days ago.  He has had a sore throat & neck pain around collarbone & upper chest.  The pain radiates to his shoulders & upper back for the past 4 days.  Yesterday, he felt very bad with lightheadedness when he stands upright.  He has had aching in his neck up to his ears.  He describes a "throbbing" pain to his back.  He also has RUQ pain.  Pain is worse with eating.  78/10 at worse.  Denies dysphagia or odynophagia. Can't lay flat.   Viral pharyngitis son age 36 last week.  +hoarseness.  Denies dysphagia or odynophagia.  GERD improved on dexilant 60mg  daily.  Pt ran out today.  Pt very anxious.  Denies rectal bleeding or melena.  Previous CT A/P showed mild bladder wall thickening.  Diarrhea has resolved.    Past Medical History  Diagnosis Date  . GERD (gastroesophageal reflux disease)     mild erosive esophagitis   . [facility] spotted fever) 2008  . Tubular adenoma of colon     Past Surgical History  Procedure Date  . Esophagogastroduodenoscopy 08/09/12    Rourk-mild erosive reflux esophagitis  . Colonoscopy 08/09/12    Rourk-cecal tubular adenoma    Current Outpatient Prescriptions  Medication Sig Dispense Refill  . calcium carbonate (TUMS - DOSED IN MG ELEMENTAL CALCIUM) 500 MG chewable tablet Chew 1 tablet by mouth daily as needed. Heartburn.      . dexlansoprazole (DEXILANT) 60 MG capsule Take 1 capsule (60 mg total) by mouth daily.  30 capsule  11  . pseudoephedrine (SUDAFED 12 HOUR) 120 MG 12 hr tablet Take 120 mg by mouth every 12 (twelve) hours as needed. Allergies.        Allergies as of 08/15/2012 - Review Complete 08/15/2012    Allergen Reaction Noted  . Peanuts (peanut oil)  07/24/2012  . Penicillins  07/24/2012  . Wheat bran Rash 08/09/2012  Review of Systems: See HPI, otherwise negative  Physical Exam: BP 122/76  Pulse 71  Temp 97.4 F (36.3 C) (Temporal)  Ht 6\' 2"  (1.88 m)  Wt 244 lb (110.678 kg)  BMI 31.33 kg/m2 No LMP for male patient. General:   Alert,  Well-developed, obese, anxious-appearing male in NAD Eyes:  Sclera clear, no icterus.   Conjunctiva pink. Mouth:  Posterior pharynx without exudate.  Mild erythema.  No deformity or lesions,oropharynx pink & moist. Neck:  Supple; +TTP entire anterior neck.  No masses or thyromegaly.  No adenopathy.  NO SQ emphysema. Lungs:  Clear throughout to auscultation.   No wheezes, crackles, or rhonchi. No acute distress. Heart:  Regular rate and rhythm; no murmurs, clicks, rubs,  or gallops. Abdomen:  Protuberant. Normal bowel sounds.  No bruits.  Soft, non-distended.  Moderate tenderness to palpation of upper abdomen as well around umbilicus & RUQ. No masses, hepatosplenomegaly or hernias noted.  No guarding or rebound tenderness.   Rectal:  Deferred. Extremities:  No clubbing or edema. Neurologic:  Alert and oriented x4;  grossly normal neurologically. Skin:  Intact without significant lesions or rashes. Psych:  Alert, anxious.

## 2012-08-15 NOTE — Assessment & Plan Note (Signed)
Improved on Dexilant 60mg  daily, #2 boxes samples given  Follow up with your Jenean Lindau, NP about bladder wall thickening seen on CT

## 2012-08-15 NOTE — Telephone Encounter (Signed)
Radiology is aware.

## 2012-08-15 NOTE — Telephone Encounter (Signed)
Tammy from radiology called- pts ct scans are negative. I spoke with pt and informed him so he could go home. He is requesting that we call him back today with any further recommendations. Will route to AS because KJ out of the office. Please advise

## 2012-08-15 NOTE — Telephone Encounter (Signed)
Spoke with Dr. Jena Gauss. Do not feel this is GI related. Short course of Tramadol sent to pharmacy.  Follow-up with PCP if no improvement or worsens.

## 2012-08-15 NOTE — Telephone Encounter (Signed)
Pt and radiology aware, tried to get precert from insurance- they are requesting peer to peer review. KJ has number and is aware to call and get precert. Pt stated he will not begin ct procedure until he knows for sure if the insurance is going to pay or not.   KJ please advise radiology if precert is given.

## 2012-08-15 NOTE — Telephone Encounter (Signed)
Tried to call pt- LMOM 

## 2012-08-15 NOTE — Telephone Encounter (Signed)
Authorization #[id number] for CT neck/chest with IV contrast

## 2012-08-15 NOTE — Telephone Encounter (Signed)
Discussed w/ Dr Jena Gauss Pt needs STAT CT neck/chest. Orders placed.  Spoke w/ radiology & pt should come now. Raynelle Fanning, LPN advised to contact pt to go to radiology now.

## 2012-08-15 NOTE — Assessment & Plan Note (Addendum)
Shawn Brandt is a pleasant 41 y.o. male seen as an urgent work-in 6 days after EGD for mild erosive esophagitis for severe neck & upper anterior chest pain, dizziness with standing.  Pain worse with lying supine.  Discussed w/ Dr Jena Gauss.  Perforation or abscess remain in differential.  Atypical for cardiac symptoms.  Anxiety may be contributing factor.  He has had chronic abdominal, now RUQ pain, however this does not seem to correlate with neck/chest symptoms.  This would be odd presentation for biliary dyskinesia too.  STAT CT neck/chest with IV contrast

## 2012-08-15 NOTE — Patient Instructions (Addendum)
We will call you with results of your HIDA (gallbladder scan). Dexilant 60mg  daily for acid reflux.   Next colonoscopy 07/2017 Follow up with your regular doctor about bladder wall thickening seen on CT

## 2012-08-15 NOTE — Progress Notes (Signed)
Faxed to PCP

## 2012-08-16 ENCOUNTER — Encounter: Payer: Self-pay | Admitting: Internal Medicine

## 2012-08-16 ENCOUNTER — Ambulatory Visit: Payer: BC Managed Care – PPO | Admitting: Urgent Care

## 2012-08-16 ENCOUNTER — Encounter: Payer: Self-pay | Admitting: *Deleted

## 2012-08-16 NOTE — Telephone Encounter (Signed)
LMOM to check on pt for PR. If still with chest & neck pain, he should see PCP today. Thanks

## 2012-08-16 NOTE — Telephone Encounter (Signed)
Tried to call pt- LMOM 

## 2012-08-17 ENCOUNTER — Ambulatory Visit (HOSPITAL_COMMUNITY)
Admission: RE | Admit: 2012-08-17 | Discharge: 2012-08-17 | Disposition: A | Discharge status: Home / Self Care | Payer: BC Managed Care – PPO | Source: Ambulatory Visit | Attending: Urgent Care | Admitting: Urgent Care

## 2012-08-17 ENCOUNTER — Encounter (HOSPITAL_COMMUNITY): Payer: Self-pay

## 2012-08-17 DIAGNOSIS — R1011 Right upper quadrant pain: Secondary | ICD-10-CM

## 2012-08-17 HISTORY — DX: Unspecified asthma, uncomplicated: J45.909

## 2012-08-17 MED ORDER — TECHNETIUM TC 99M MEBROFENIN IV KIT
5.0000 | PACK | Freq: Once | INTRAVENOUS | Status: AC | PRN
  Administered 2012-08-17: 5.2 via INTRAVENOUS

## 2012-08-17 NOTE — Telephone Encounter (Signed)
Tried to call pt- LMOM 

## 2012-08-20 LAB — COMPREHENSIVE METABOLIC PANEL
ALT: 18 U/L (ref 10–40)
Alkaline Phosphatase: 52 U/L
BUN: 10 mg/dL (ref 4–21)
Chloride: 99 mmol/L
Creat: 1.04
Sodium: 137 mmol/L (ref 137–147)
Total Bilirubin: 1.5 mg/dL

## 2012-08-20 LAB — CBC: HCT: 46 %

## 2012-08-20 NOTE — Telephone Encounter (Signed)
Shawn Brandt, I spoke with him today.  Thanks

## 2012-08-20 NOTE — Progress Notes (Signed)
Quick Note:  Spoke w/ pt. Has appt w/ Ninfa Linden today. Fever 102, vomiting & headache started yesterday. Neck/chest pain resolved. No pain w/ CCK. Pain started 2 hrs after leaving hospital.  Results given. Needs OV w/ RMR ONLY in 3 months. He will call sooner if abdominal pain returns or persistent vomiting. WU:[id number], [name], FNP  ______

## 2012-08-20 NOTE — Telephone Encounter (Signed)
Tried to call pt for PR- still cannot reach pt.- LMOM

## 2012-08-20 NOTE — Progress Notes (Signed)
Results faxed to PCP, recall made for 3 months

## 2012-08-24 HISTORY — PX: CHOLECYSTECTOMY: SHX55

## 2012-09-06 ENCOUNTER — Telehealth: Payer: Self-pay | Admitting: Urgent Care

## 2012-09-06 NOTE — Telephone Encounter (Signed)
Agree,more info needed before we could make any recommendations regarding need for a cholecystectomy

## 2012-09-06 NOTE — Telephone Encounter (Signed)
Pt called this morning with multiple questions and concerns about possible surgery regarding his gallbladder and is anxious to speak with KJ. I tried offering him OV with KJ for tomorrow, but his work schedule doesn't allow the time. I can get him in on Monday with KJ before 1pm if needed, but patient is wanting to speak with KJ first to see if OV is needed. Please call him back at [phone]

## 2012-09-06 NOTE — Telephone Encounter (Signed)
Spoke w/ pt. Recent gastroenteritis in his sons.  Pt then had same symtoms (diarrhea, headache, vomiting) x 2days Went to Ninfa Linden, NP elevated bilirubin & epigastric pain. Pt saw Dr Birdie Sons (surgeon) in [facility] Tuesday to discuss possible cholecystectomy. Was told 50/50% by chance it may not resolve pain. Pt advised by me that based on info I had (no labs or notes yet), I felt like cholecystectomy may be diagnostic maneuver as the next step based on borderline HIDA, elevated bili & persistent biliary type pain.  Multiple questions answered.   He agrees to call & have labs & surgical consult note faxed here. He is advised to call if pain persists

## 2012-09-10 NOTE — Telephone Encounter (Signed)
Labs reviewed from 08/20/12; CBC shows platelet count 435. Otherwise normal. CMP normal except total bilirubin 1.5 (unfractionated)

## 2012-09-10 NOTE — Telephone Encounter (Signed)
Dawn,  Would you please follow up on this?  Pt was supposed to send labs from Ninfa Linden & report from Dr Birdie Sons as below.  Thanks

## 2012-09-10 NOTE — Telephone Encounter (Signed)
Requested labs from Ninfa Linden, Requested OV notes from Dr Birdie Sons, they need a release from the pt, called the pt, left message for them to call us back to get release.

## 2012-09-12 NOTE — Telephone Encounter (Signed)
Faxed request again yesterday for reports from Dr Birdie Sons

## 2012-09-17 NOTE — Telephone Encounter (Signed)
I called Dr Purvis Sheffield office this am to find out where my ov note is, they said we should have it by end of day today or tomorrow, fyi.

## 2012-09-18 NOTE — Telephone Encounter (Signed)
Dr. Rosezetta Schlatter notes reviewed from 09/04/12

## 2012-09-18 NOTE — Telephone Encounter (Signed)
Received, on your desk

## 2012-10-04 ENCOUNTER — Ambulatory Visit: Payer: Self-pay | Admitting: General Surgery

## 2012-10-05 LAB — PATHOLOGY REPORT

## 2012-10-10 ENCOUNTER — Encounter: Payer: Self-pay | Admitting: *Deleted

## 2012-10-24 DIAGNOSIS — J189 Pneumonia, unspecified organism: Secondary | ICD-10-CM

## 2012-10-24 HISTORY — DX: Pneumonia, unspecified organism: J18.9

## 2013-01-07 ENCOUNTER — Other Ambulatory Visit (HOSPITAL_COMMUNITY): Payer: Self-pay | Admitting: Nurse Practitioner

## 2013-01-07 ENCOUNTER — Ambulatory Visit (HOSPITAL_COMMUNITY)
Admission: RE | Admit: 2013-01-07 | Discharge: 2013-01-07 | Disposition: A | Discharge status: Home / Self Care | Payer: Managed Care, Other (non HMO) | Source: Ambulatory Visit | Attending: Nurse Practitioner | Admitting: Nurse Practitioner

## 2013-01-07 DIAGNOSIS — R0989 Other specified symptoms and signs involving the circulatory and respiratory systems: Secondary | ICD-10-CM | POA: Insufficient documentation

## 2013-01-07 DIAGNOSIS — R05 Cough: Secondary | ICD-10-CM | POA: Insufficient documentation

## 2013-01-07 DIAGNOSIS — R0600 Dyspnea, unspecified: Secondary | ICD-10-CM

## 2013-01-07 DIAGNOSIS — R059 Cough, unspecified: Secondary | ICD-10-CM | POA: Insufficient documentation

## 2013-01-07 DIAGNOSIS — R0609 Other forms of dyspnea: Secondary | ICD-10-CM | POA: Insufficient documentation

## 2013-01-15 IMAGING — NM NM HEPATO W/GB/PHARM/[PERSON_NAME]
2 series · 12 of 12 positions shown · non-contrast
Comparison: none

CLINICAL DATA: Right upper abdominal pain.

[hida · 3.20mm/px · 6 of 60 frames shown (1 of 2)]
[frame 6/60]
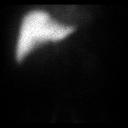
[frame 16/60]
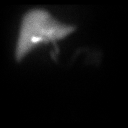
[frame 26/60]
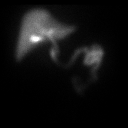
[frame 36/60]
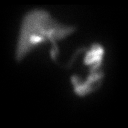
[frame 46/60]
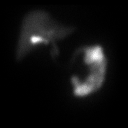
[frame 56/60]
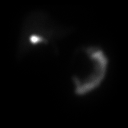

[hida · 3.20mm/px · 6 of 60 frames shown (2 of 2)]
[frame 6/60]
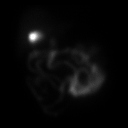
[frame 16/60]
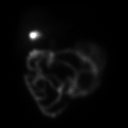
[frame 26/60]
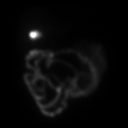
[frame 36/60]
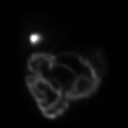
[frame 46/60]
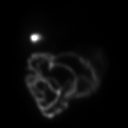
[frame 56/60]
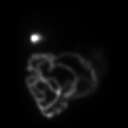

[12 of 12 positions shown; findings below may reference images not displayed]

HEPATOBILIARY SCINTIGRAPHY WITH EJECTION FRACTION

Anterior imaging afterR.LmUi 2cIIT Choletec IV. There is prompt
clearance of the radiopharmaceutical from the blood pool. Timely
visualization of activity in central bile ducts, small bowel, and
gallbladder.
After 1 hour,the patient ingested 8 ounces Ensure Plus orally,
and imaging continued.  The calculated gallbladder ejection
(Ziessman et al., 1332 J. Nuclear Med).]

IMPRESSION
1. Patency of cystic and common bile ducts.
2. Borderline normal gallbladder ejection fraction.

## 2013-05-16 ENCOUNTER — Encounter: Payer: Self-pay | Admitting: Gastroenterology

## 2013-05-16 ENCOUNTER — Ambulatory Visit (INDEPENDENT_AMBULATORY_CARE_PROVIDER_SITE_OTHER): Payer: Self-pay | Admitting: Gastroenterology

## 2013-05-16 VITALS — BP 136/79 | HR 74 | Temp 98.5°F | Ht 74.0 in | Wt 250.6 lb

## 2013-05-16 DIAGNOSIS — R1013 Epigastric pain: Secondary | ICD-10-CM

## 2013-05-16 DIAGNOSIS — R197 Diarrhea, unspecified: Secondary | ICD-10-CM

## 2013-05-16 DIAGNOSIS — R1012 Left upper quadrant pain: Secondary | ICD-10-CM

## 2013-05-16 DIAGNOSIS — R109 Unspecified abdominal pain: Secondary | ICD-10-CM

## 2013-05-16 MED ORDER — DICYCLOMINE HCL 10 MG PO CAPS: 10.0000 mg | ORAL_CAPSULE | ORAL | Status: DC | PRN

## 2013-05-16 NOTE — Progress Notes (Signed)
CC PCP 

## 2013-05-16 NOTE — Assessment & Plan Note (Addendum)
42 y/o male with intermittent postprandial abdominal pain, occasionally associated with vomiting. S/p cholecystectomy last fall. States he had microlithiasis and "narrowed" CBD noted on intraoperative cholangiogram. Notes pp abdominal pain (migratory) related to fatty/greasy foods. Just recently resumed PPI. ?symptoms related to GERD +/- IBS. Doubt biliary etiology but need to find out more information regarding "narrowed" CBD.  CBC, CMET, lipase. Celiac screen. Continue omeprazole for 10-14 days. If symptoms resolved, continue PPI. Otherwise, try bentyl qid prn. rx provided. PR in 2 weeks. If stools increased from baseline, consider C.Diff PCR. Add yogurt or proviotic.

## 2013-05-16 NOTE — Patient Instructions (Addendum)
1. Please have blood work done at [facility]. 2. Please take omeprazole 20mg  daily for next 10-14 days. If you have no further abdominal pain, continue the medication. If you have persistent abdominal pain, please start dicyclomine up to three times per day as needed. 3. If you increased number of stools from your baseline, please let us know. Sometimes frequent antibiotics can cause infection in your colon called C.Diff.  4. Try daily yogurt or a probiotic if you have excessive gas or increased number of stools.  5. Call with a progress report in a couple of weeks to let us know how you are doing.

## 2013-05-16 NOTE — Progress Notes (Signed)
Primary Care Physician: Erasmo Downer, MD  Primary Gastroenterologist:  Roetta Sessions, MD   Chief Complaint  Patient presents with  . Follow-up  . Abdominal Pain    comes and goes, seems to be food related per pt.    HPI: Shawn Brandt is a 42 y.o. male here for f/u of abdominal pain. Last seen in 07/2012. Initially seen for severe upper abdominal pain, leg the pain was more left lower quadrant. Sometimes pain in between the shoulder blades. Positive heartburn at that time. EGD and colonoscopy done by Dr. Jena Gauss. He had moderate erosive reflux esophagitis. He had a tubular adenoma removed from the cecum. After the procedures he complained of chest and neck pain and had a chest CT neck CT which were negative. CT of the abdomen and pelvis with contrast in October 2013 show nonspecific minimal thickening of the distal terminal ileal wall, nonspecific mild thickening of the urinary bladder wall. Urinalysis and stool studies were negative. Abdominal ultrasound in February 2013 unremarkable. He went on to have a HIDA scan in October 2013 with borderline normal gallbladder ejection fraction. Patient states he won't have his gallbladder removed at [facility] he was told he had multiple stones less than 1 mm. He describes having a cholangiogram and was told that his common bile duct was narrowed.   He states overall his abdominal discomfort had improved after his gallbladder surgery. However he has had some intermittent episodes of pain in epigastrium with meals. He admits that when he was feeling better he came off of his omeprazole. He tried Dexilant samples for 10 days but didn't seem to help at that time and he could not afford the RX.  He notes that he started having more problems and was given antibiotics for pneumonia in March. He had a cyst drained from his back in June and received antibiotics again and noted more GI symptoms.  July 10th, one hour after meals, sharp pain in epigastric  area and radiating into lower abdomen, sharp pains into rectum. Broke out into sweat, vomited once. Went away after two hours. Since then, one hour after meals, has pain somewhere. Usually upper abdomen or LUQ/left mid abdominal pain. Goes away within 1-2 hours. BM after pain goes away. Positive gas. No heartburn. Sometimes pain between shoulder blades. Notes more symptoms with fatty foods and rarely notes symptoms with salads. He admits that he is very concerned about developing cancer because of his family history. As far as he can remember he's had 3-4 loose stools daily. Denies melena or rectal bleeding. He restarted omeprazole back on Monday and states he has not had any problems with his abdominal pain in the last 2 days.  Monday.Very little heartburn since gallbladder taken out. Smokeless tobacco use.    Current Outpatient Prescriptions  Medication Sig Dispense Refill  . omeprazole (PRILOSEC) 20 MG capsule Take 20 mg by mouth daily.      . pseudoephedrine (SUDAFED 12 HOUR) 120 MG 12 hr tablet Take 120 mg by mouth every 12 (twelve) hours as needed. Allergies.       No current facility-administered medications for this visit.    Allergies as of 05/16/2013 - Review Complete 05/16/2013  Allergen Reaction Noted  . Peanuts (peanut oil)  07/24/2012  . Penicillins  07/24/2012  . Wheat bran Rash 08/09/2012    ROS:  General: Negative for anorexia, weight loss, fever, chills, fatigue, weakness. ENT: Negative for hoarseness, difficulty swallowing , nasal congestion. CV: Negative for chest pain,  angina, palpitations, dyspnea on exertion, peripheral edema.  Respiratory: Negative for dyspnea at rest, dyspnea on exertion, cough, sputum, wheezing.  GI: See history of present illness. GU:  Negative for dysuria, hematuria, urinary incontinence, urinary frequency, nocturnal urination.  Endo: Negative for unusual weight change.    Physical Examination:   BP 136/79  Pulse 74  Temp(Src) 98.5 F  (36.9 C) (Oral)  Ht 6\' 2"  (1.88 m)  Wt 250 lb 9.6 oz (113.671 kg)  BMI 32.16 kg/m2  General: Well-nourished, well-developed in no acute distress.  Eyes: No icterus. Mouth: Oropharyngeal mucosa moist and pink , no lesions erythema or exudate. Lungs: Clear to auscultation bilaterally.  Heart: Regular rate and rhythm, no murmurs rubs or gallops.  Abdomen: Bowel sounds are normal, nontender, nondistended, no hepatosplenomegaly or masses, no abdominal bruits or hernia , no rebound or guarding.   Extremities: No lower extremity edema. No clubbing or deformities. Neuro: Alert and oriented x 4   Skin: Warm and dry, no jaundice.   Psych: Alert and cooperative, normal mood and affect.  Imaging Studies: No results found.

## 2013-05-31 ENCOUNTER — Ambulatory Visit: Payer: BC Managed Care – PPO | Admitting: Internal Medicine

## 2013-06-05 NOTE — Progress Notes (Signed)
Received labs from 05/17/2013. White blood cell count 7100, hemoglobin 14.7, hematocrit 43.6, MCV 89.3, platelets 439,000, total bilirubin 1.1, alkaline phosphatase 59, AST 19, ALT 39, albumin 4.2, lipase 151 (normal range 73-393), IgA 153, TTG less than 2.  Reviewed operative note dated 10/04/2012, laparoscopic cholecystectomy with intraoperative cholangiograms by Dr. Donnalee Curry. No path report sent. Noted to have very small right and left as well as common hepatic ducts on cholangiogram but no dilatation of common bile duct. Gallbladder was moderately distended and extensive adhesions of the omentum to the lower portion of the gallbladder were removed. Gallbladder specimen opened, showed several small black pigment stones.  Please let pt know his labs were normal. Reviewed op note, CBD normal, small right/left/common hepatic ducts likely insignificant. Please find out how he is doing, now back on PPI.

## 2013-06-07 LAB — CBC
HGB: 14.7 g/dL
MCV: 89.3 fL
WBC: 7.1
platelet count: 439

## 2013-06-07 LAB — HEPATIC FUNCTION PANEL
ALT: 39 U/L (ref 10–40)
Albumin: 4.2
Total Bilirubin: 1.1 mg/dL

## 2013-06-07 NOTE — Progress Notes (Signed)
Tried to call pt- LMOM 

## 2013-06-11 NOTE — Progress Notes (Signed)
Pt is aware. He stated he has been doing well. Episodes of pain are not as frequent as they were before. He is keeping up with what he is eating and what is triggering symptoms. So far, hot dogs and caffeine are two that seem to be part of the problem. He is going to continue and will let us know how he is doing.

## 2013-07-24 ENCOUNTER — Telehealth: Payer: Self-pay | Admitting: Internal Medicine

## 2013-07-24 NOTE — Telephone Encounter (Signed)
Spoke with pt- he started having some abd pains last night that would come and go. After he got up this morning he had a bm and noticed blood in the water and with wiping. He stated he didn't think the blood was in the stool. He had a second bm right before I called and did not notice any blood that time. He has not had any Nausea, vomiting or fever. The pain is better today then it was yesterday. The only thing he has been doing different is he had started taking naprosyn for a pain in his neck. He stopped taking it yesterday. Pt is very concerned. He is will be home today until around 2 pm.

## 2013-07-24 NOTE — Telephone Encounter (Addendum)
I would hold off on any NSAIDs right now. Make sure he is taking his PPI. Schedule him a nonurgent visit. If he has severe abdominal pain, significant rectal bleeding, melena, fever he should go to the emergency department.

## 2013-07-24 NOTE — Telephone Encounter (Signed)
Pt aware. He is taking omeprazole.  Darl Pikes, please schedule pt appt. He is requesting that you call him on his cell number once you get it scheduled.

## 2013-07-24 NOTE — Telephone Encounter (Signed)
Shawn Brandt called needing to be seen asap, he is having quite a bit of rectal bleeding and abdominal pain and he states he dont think its coming from hemorrhoids, please advise

## 2013-07-26 NOTE — Telephone Encounter (Signed)
Pt is aware of OV °

## 2013-08-09 ENCOUNTER — Ambulatory Visit: Payer: BC Managed Care – PPO | Admitting: Gastroenterology

## 2015-02-10 NOTE — Op Note (Signed)
PATIENT NAME:  Shawn Brandt, Shawn Brandt MR#:  [id number] DATE OF BIRTH:  11-04-1970  DATE OF PROCEDURE:  10/04/2012  PREOPERATIVE DIAGNOSIS: Chronic cholecystitis.   POSTOPERATIVE DIAGNOSIS: Chronic cholecystitis and cholelithiasis.   OPERATIVE PROCEDURE: Laparoscopic cholecystectomy with intraoperative cholangiograms.   SURGEON: Robert Bellow, MD  ANESTHESIA: General endotracheal under Dr. Carolin Sicks.   ESTIMATED BLOOD LOSS: Less than 5 mL.   CLINICAL NOTE: This 43 year old male has had episodic abdominal pain in the right upper quadrant with radiation to the back. Ultrasound was negative. CT scan was negative. His clinical symptoms were strongly suggestive of biliary tract disease and he was felt to be a candidate for cholecystectomy.   OPERATIVE NOTE: With the patient under adequate general anesthesia, the abdomen was prepped with ChloraPrep and draped. Hair had previously been removed with clippers in the holding area. With the patient in Trendelenburg position, a Veress needle was placed through a transumbilical incision. After assuring intra-abdominal location with the hanging drop test, the abdomen was insufflated with CO2 at 10 mmHg pressure. A 10 mm step port was expanded and inspection showed no evidence of injury from initial port placement. The patient was placed into reverse Trendelenburg position and rolled to the left. The 11 mm Xcel port was placed in the epigastrium and two 5 mm step ports placed laterally under direct vision. The gallbladder was moderately distended. It did not show acute inflammation on the fundus but there was extensive adhesions of the omentum to the lower portion of the gallbladder. The gallbladder was decompressed with clear golden bile. He was placed on cephalad traction. Using cautery dissection the adhesions of the omentum to the gallbladder surface were taken down. Scant bleeding was noted. The cystic duct was identified. Cholangiograms were completed using  one-half strength Conray 60. A total of 30 mL of contrast was utilized. This showed a very small right and left as well as common hepatic ducts and no dilatation of the common bile duct. Free flow into the duodenum. No images were able to be stored on the unit used for the imaging. The cystic duct and cystic artery were doubly clipped and divided. The gallbladder was removed from the liver bed making use of hook cautery dissection. It was then delivered through the umbilical port site. Inspection from the epigastric area showed no evidence of injury from initial port placement. The abdomen was then irrigated with lactated Ringer's solution. The abdomen was desufflated and ports removed under direct vision. The open gallbladder specimen showed several small black pigment stones. The fascia at the umbilicus was approximated with a single 0 Vicryl suture. The skin incisions were closed with 4-0 Vicryl subcuticular sutures. Benzoin, Steri-Strips, Telfa, and Tegaderm dressing was then applied.   Patient tolerated procedure well and was taken to the recovery room in stable condition.    ____________________________ Robert Bellow, MD jwb:cms D: 10/04/2012 11:30:59 ET T: 10/04/2012 11:44:53 ET JOB#: [id number]  cc: Robert Bellow, MD, <Dictator> Eather Colas. Sharlett Iles, FNP-C Harvie Morua Amedeo Kinsman MD ELECTRONICALLY SIGNED 10/09/2012 13:54

## 2017-07-06 ENCOUNTER — Encounter: Payer: Self-pay | Admitting: Internal Medicine

## 2019-05-22 ENCOUNTER — Telehealth: Payer: Self-pay | Admitting: *Deleted

## 2019-05-22 ENCOUNTER — Encounter: Payer: Self-pay | Admitting: Gastroenterology

## 2019-05-22 ENCOUNTER — Other Ambulatory Visit: Payer: Self-pay | Admitting: *Deleted

## 2019-05-22 ENCOUNTER — Encounter: Payer: Self-pay | Admitting: *Deleted

## 2019-05-22 ENCOUNTER — Other Ambulatory Visit: Payer: Self-pay

## 2019-05-22 ENCOUNTER — Ambulatory Visit: Payer: BC Managed Care – PPO | Admitting: Gastroenterology

## 2019-05-22 VITALS — BP 137/91 | HR 77 | Temp 97.1°F | Ht 74.0 in | Wt 281.2 lb

## 2019-05-22 DIAGNOSIS — K2100 Gastro-esophageal reflux disease with esophagitis, without bleeding: Secondary | ICD-10-CM

## 2019-05-22 DIAGNOSIS — R1032 Left lower quadrant pain: Secondary | ICD-10-CM

## 2019-05-22 DIAGNOSIS — D126 Benign neoplasm of colon, unspecified: Secondary | ICD-10-CM

## 2019-05-22 DIAGNOSIS — Z86010 Personal history of colonic polyps: Secondary | ICD-10-CM

## 2019-05-22 MED ORDER — PEG 3350-KCL-NA BICARB-NACL 420 G PO SOLR: 4000.0000 mL | Freq: Once | ORAL | 0 refills | Status: AC

## 2019-05-22 NOTE — Assessment & Plan Note (Signed)
History of esophagitis. Currently without a PPI. EGD on file from 2013. Vague bloating after eating. Gallbladder absent. No alarm signs/symptoms. Weight gain noted over the years. Resume PPI (omeprazole) once daily, 30 minutes before breakfast. Call if no improvement.

## 2019-05-22 NOTE — Telephone Encounter (Signed)
Pre-op appt letter mailed 

## 2019-05-22 NOTE — Progress Notes (Signed)
Primary Care Physician:  Tobe Sos, MD Primary Gastroenterologist:  Dr. Gala Romney   Chief Complaint  Patient presents with  . Abdominal Pain    "feels like bloating discomfort", usually occurs after eating    HPI:   Shawn Brandt is a 48 y.o. male presenting today with history of erosive reflux esophagitis, tubular adenoma in 2013 and overdue for surveillance colonoscopy.   Since Feb/March has had back problems. Has pain in LLQ intermittently, brought on by sitting for long periods of time. Will have pain in LLQ pain that extends down to his testicles if needing to urinate or have a BM. Relieved after BM. Over past few weeks will have bloating discomfort postprandially but not after every meal. After lunch, had a tight feeling in abdomen but not painful. Happening once or twice a week. Unable to pinpoint foods that cause this. Noticed discomfort with leftover manwich. Has happened with fish tacos. No dysphagia. Will have flares of reflux that occurs for a day or 2 but mainly positional. Usually nocturnally. Prilosec in the past but no PPI currently since 2018.  Ibuprofen as needed for sciatic pain. Will use restroom and still feel like he needs to have BM. Feels non-productive but not all the time. Will have to go 2-3 times to empty completely. No supplemental fiber.   Has had 2 episodes of eating food and breaking out in sweats, feels like has to have a BM, then dry heaves. Year apart. First time was shrimp in [place]. Second time BBQ at bro-in-laws.   If sits too long, LLQ pain worsens.   He notes waking from conscious sedation in the past.    Past Medical History:  Diagnosis Date  . Asthma    since age 57  . GERD (gastroesophageal reflux disease)    mild erosive esophagitis   . Pneumonia 2014  . [facility] spotted fever) 2008  . Sciatica   . Tubular adenoma of colon     Past Surgical History:  Procedure Laterality Date  . CHOLECYSTECTOMY  08/2012  .  COLONOSCOPY  08/09/12   Rourk-cecal tubular adenoma. next tcs 07/2017  . ESOPHAGOGASTRODUODENOSCOPY  08/09/12   Rourk-mild erosive reflux esophagitis    Current Outpatient Medications  Medication Sig Dispense Refill  . atorvastatin (LIPITOR) 40 MG tablet Take 1 tablet by mouth daily.    Marland Kitchen azelastine (ASTELIN) 0.1 % nasal spray Place into both nostrils as needed for rhinitis. Use in each nostril as directed    . buPROPion (WELLBUTRIN) 75 MG tablet Take 1 tablet by mouth. Takes approx once/week for tobacco sessation    . ibuprofen (ADVIL) 600 MG tablet Take 1 tablet by mouth as needed.    . pseudoephedrine (SUDAFED 12 HOUR) 120 MG 12 hr tablet Take 120 mg by mouth every 12 (twelve) hours as needed. Allergies.    Marland Kitchen tiZANidine (ZANAFLEX) 2 MG tablet Take 1 tablet by mouth as needed.    . polyethylene glycol-electrolytes (NULYTELY/GOLYTELY) 420 g solution Take 4,000 mLs by mouth once for 1 dose. 4000 mL 0   No current facility-administered medications for this visit.     Allergies as of 05/22/2019 - Review Complete 05/22/2019  Allergen Reaction Noted  . Peanuts [peanut oil]  07/24/2012  . Penicillins  07/24/2012  . Wheat bran Rash 08/09/2012    Family History  Problem Relation Age of Onset  . Cholelithiasis Mother   . Breast cancer Mother   . Diverticulitis Mother   . Cancer  Maternal Grandfather        ?  . Prostate cancer Father   . Lung cancer Father   . Pancreatic cancer Other        maternal great aunt  . Colon cancer Neg Hx     Social History   Socioeconomic History  . Marital status: Married    Spouse name: Not on file  . Number of children: 2  . Years of education: Not on file  . Highest education level: Not on file  Occupational History  . Occupation: court system  Social Needs  . Financial resource strain: Not on file  . Food insecurity    Worry: Not on file    Inability: Not on file  . Transportation needs    Medical: Not on file    Non-medical: Not on  file  Tobacco Use  . Smoking status: Former Smoker    Packs/day: 0.50    Years: 6.00    Pack years: 3.00    Types: Cigarettes  . Smokeless tobacco: Current User    Types: Snuff  . Tobacco comment: currently dips  Substance and Sexual Activity  . Alcohol use: Yes    Comment: very seldom  . Drug use: No  . Sexual activity: Not on file  Lifestyle  . Physical activity    Days per week: Not on file    Minutes per session: Not on file  . Stress: Not on file  Relationships  . Social Herbalist on phone: Not on file    Gets together: Not on file    Attends religious service: Not on file    Active member of club or organization: Not on file    Attends meetings of clubs or organizations: Not on file    Relationship status: Not on file  . Intimate partner violence    Fear of current or ex partner: Not on file    Emotionally abused: Not on file    Physically abused: Not on file    Forced sexual activity: Not on file  Other Topics Concern  . Not on file  Social History Narrative  . Not on file    Review of Systems: Gen: Denies any fever, chills, fatigue, weight loss, lack of appetite.  CV: Denies chest pain, heart palpitations, peripheral edema, syncope.  Resp: Denies shortness of breath at rest or with exertion. Denies wheezing or cough.  GI:see HPI GU : Denies urinary burning, urinary frequency, urinary hesitancy MS: Denies joint pain, muscle weakness, cramps, or limitation of movement.  Derm: Denies rash, itching, dry skin Psych: Denies depression, anxiety, memory loss, and confusion Heme: Denies bruising, bleeding, and enlarged lymph nodes.  Physical Exam: BP (!) 137/91   Pulse 77   Temp (!) 97.1 F (36.2 C) (Temporal)   Ht 6\' 2"  (1.88 m)   Wt 281 lb 3.2 oz (127.6 kg)   BMI 36.10 kg/m  General:   Alert and oriented. Pleasant and cooperative. Well-nourished and well-developed.  Head:  Normocephalic and atraumatic. Eyes:  Without icterus, sclera clear and  conjunctiva pink.  Ears:  Normal auditory acuity. Nose:  No deformity, discharge,  or lesions. Mouth:  No deformity or lesions, oral mucosa pink.  Lungs:  Clear to auscultation bilaterally.  Heart:  S1, S2 present without murmurs appreciated.  Abdomen:  +BS, soft, non-tender and non-distended. No HSM noted. No guarding or rebound. No masses appreciated.  Rectal:  Deferred  Msk:  Symmetrical without gross deformities. Normal posture.  Extremities:  Without  edema. Neurologic:  Alert and  oriented x4 Psych:  Alert and cooperative. Normal mood and affect.

## 2019-05-22 NOTE — Assessment & Plan Note (Signed)
48 year old male with history of adenoma in 2013, overdue for surveillance. Reports failing conscious sedation in past, so will pursue with Propofol.  Proceed with TCS with Dr. Gala Romney in near future: the risks, benefits, and alternatives have been discussed with the patient in detail. The patient states understanding and desires to proceed. Propofol

## 2019-05-22 NOTE — Patient Instructions (Signed)
Resume taking omeprazole (Prilosec) once each morning, 30 minutes before breakfast. Call me if persistent issues despi this.   I recommend starting Benefiber 1 teaspoon daily and working up to 3 teaspoons a day.   We have scheduled you for a colonoscopy in the near future with Dr. Gala Romney.  We will see you in follow-up thereafter!   It was a pleasure to see you today. I strive to create trusting relationships with patients to provide genuine, compassionate, and quality care. I value your feedback. If you receive a survey regarding your visit,  I greatly appreciate you taking time to fill this out.   Annitta Needs, PhD, ANP-BC [facility]

## 2019-05-22 NOTE — Assessment & Plan Note (Signed)
Query musculoskeletal as notes worsening with sitting for long periods. Known chronic back pain. Constipation also playing a role as improved s/p BM. Start Benefiber daily due to occasional unproductive stools. Call if persistent. No imaging indicated unless worsening or alarm features.

## 2019-05-23 NOTE — Progress Notes (Signed)
cc'ed to pcp °

## 2019-07-17 ENCOUNTER — Encounter: Payer: Self-pay | Admitting: *Deleted

## 2019-07-17 ENCOUNTER — Telehealth: Payer: Self-pay | Admitting: Internal Medicine

## 2019-07-17 ENCOUNTER — Other Ambulatory Visit: Payer: Self-pay | Admitting: *Deleted

## 2019-07-17 NOTE — Telephone Encounter (Signed)
Pt wants to reschedule his colonoscopy w/propofol with RMR on 10/8. Please call [phone]

## 2019-07-17 NOTE — Telephone Encounter (Signed)
Called patient. He needs to r/s TCS. Patient now on for 12/10 at 11:15am. Patient aware will mail new prep instructions with pre-op/covid-19 appt. Called endo and LMOVM making aware.

## 2019-07-30 ENCOUNTER — Other Ambulatory Visit (HOSPITAL_COMMUNITY): Payer: BC Managed Care – PPO

## 2019-09-30 ENCOUNTER — Encounter (HOSPITAL_COMMUNITY): Payer: Self-pay

## 2019-10-01 ENCOUNTER — Encounter (HOSPITAL_COMMUNITY)
Admission: RE | Admit: 2019-10-01 | Discharge: 2019-10-01 | Disposition: A | Discharge status: Home / Self Care | Payer: BC Managed Care – PPO | Source: Ambulatory Visit | Attending: Internal Medicine | Admitting: Internal Medicine

## 2019-10-01 ENCOUNTER — Other Ambulatory Visit (HOSPITAL_COMMUNITY)
Admission: RE | Admit: 2019-10-01 | Discharge: 2019-10-01 | Disposition: A | Discharge status: Home / Self Care | Payer: BC Managed Care – PPO | Source: Ambulatory Visit | Attending: Internal Medicine | Admitting: Internal Medicine

## 2019-10-01 ENCOUNTER — Other Ambulatory Visit: Payer: Self-pay

## 2019-10-01 DIAGNOSIS — Z20828 Contact with and (suspected) exposure to other viral communicable diseases: Secondary | ICD-10-CM | POA: Insufficient documentation

## 2019-10-01 DIAGNOSIS — Z01812 Encounter for preprocedural laboratory examination: Secondary | ICD-10-CM | POA: Diagnosis not present

## 2019-10-01 HISTORY — DX: Other specified postprocedural states: R11.2

## 2019-10-01 HISTORY — DX: Other specified postprocedural states: Z98.890

## 2019-10-01 HISTORY — DX: Other complications of anesthesia, initial encounter: T88.59XA

## 2019-10-01 LAB — SARS CORONAVIRUS 2 (TAT 6-24 HRS): SARS Coronavirus 2: NEGATIVE

## 2019-10-03 ENCOUNTER — Ambulatory Visit (HOSPITAL_COMMUNITY): Payer: BC Managed Care – PPO | Admitting: Anesthesiology

## 2019-10-03 ENCOUNTER — Encounter (HOSPITAL_COMMUNITY)
Admission: RE | Disposition: A | Discharge status: Home / Self Care | Payer: Self-pay | Source: Home / Self Care | Attending: Internal Medicine

## 2019-10-03 ENCOUNTER — Encounter (HOSPITAL_COMMUNITY): Payer: Self-pay | Admitting: Internal Medicine

## 2019-10-03 ENCOUNTER — Other Ambulatory Visit: Payer: Self-pay

## 2019-10-03 ENCOUNTER — Ambulatory Visit (HOSPITAL_COMMUNITY)
Admission: RE | Admit: 2019-10-03 | Discharge: 2019-10-03 | Disposition: A | Discharge status: Home / Self Care | Payer: BC Managed Care – PPO | Attending: Internal Medicine | Admitting: Internal Medicine

## 2019-10-03 DIAGNOSIS — Z87891 Personal history of nicotine dependence: Secondary | ICD-10-CM | POA: Insufficient documentation

## 2019-10-03 DIAGNOSIS — Z1211 Encounter for screening for malignant neoplasm of colon: Secondary | ICD-10-CM | POA: Diagnosis not present

## 2019-10-03 DIAGNOSIS — K219 Gastro-esophageal reflux disease without esophagitis: Secondary | ICD-10-CM | POA: Insufficient documentation

## 2019-10-03 DIAGNOSIS — Z880 Allergy status to penicillin: Secondary | ICD-10-CM | POA: Diagnosis not present

## 2019-10-03 DIAGNOSIS — G709 Myoneural disorder, unspecified: Secondary | ICD-10-CM | POA: Diagnosis not present

## 2019-10-03 DIAGNOSIS — K635 Polyp of colon: Secondary | ICD-10-CM | POA: Diagnosis not present

## 2019-10-03 DIAGNOSIS — K5730 Diverticulosis of large intestine without perforation or abscess without bleeding: Secondary | ICD-10-CM | POA: Diagnosis not present

## 2019-10-03 DIAGNOSIS — J45909 Unspecified asthma, uncomplicated: Secondary | ICD-10-CM | POA: Insufficient documentation

## 2019-10-03 DIAGNOSIS — D123 Benign neoplasm of transverse colon: Secondary | ICD-10-CM | POA: Insufficient documentation

## 2019-10-03 DIAGNOSIS — Z86010 Personal history of colonic polyps: Secondary | ICD-10-CM | POA: Insufficient documentation

## 2019-10-03 DIAGNOSIS — Z79899 Other long term (current) drug therapy: Secondary | ICD-10-CM | POA: Insufficient documentation

## 2019-10-03 HISTORY — PX: POLYPECTOMY: SHX5525

## 2019-10-03 HISTORY — PX: COLONOSCOPY WITH PROPOFOL: SHX5780

## 2019-10-03 SURGERY — COLONOSCOPY WITH PROPOFOL
Anesthesia: General

## 2019-10-03 MED ORDER — CHLORHEXIDINE GLUCONATE CLOTH 2 % EX PADS: 6.0000 | MEDICATED_PAD | Freq: Once | CUTANEOUS | Status: DC

## 2019-10-03 MED ORDER — GLYCOPYRROLATE 0.2 MG/ML IJ SOLN
INTRAMUSCULAR | Status: DC | PRN
  Administered 2019-10-03: .2 mg via INTRAVENOUS

## 2019-10-03 MED ORDER — GLYCOPYRROLATE PF 0.2 MG/ML IJ SOSY
PREFILLED_SYRINGE | INTRAMUSCULAR | Status: AC
  Filled 2019-10-03: qty 1

## 2019-10-03 MED ORDER — EPHEDRINE 5 MG/ML INJ
INTRAVENOUS | Status: AC
  Filled 2019-10-03: qty 10

## 2019-10-03 MED ORDER — LIDOCAINE HCL (CARDIAC) PF 50 MG/5ML IV SOSY
PREFILLED_SYRINGE | INTRAVENOUS | Status: DC | PRN
  Administered 2019-10-03: 80 mg via INTRAVENOUS

## 2019-10-03 MED ORDER — LACTATED RINGERS IV SOLN
Freq: Once | INTRAVENOUS | Status: AC
  Administered 2019-10-03: 11:00:00 via INTRAVENOUS

## 2019-10-03 MED ORDER — PHENYLEPHRINE 40 MCG/ML (10ML) SYRINGE FOR IV PUSH (FOR BLOOD PRESSURE SUPPORT)
PREFILLED_SYRINGE | INTRAVENOUS | Status: AC
  Filled 2019-10-03: qty 30

## 2019-10-03 MED ORDER — KETAMINE HCL 50 MG/5ML IJ SOSY
PREFILLED_SYRINGE | INTRAMUSCULAR | Status: AC
  Filled 2019-10-03: qty 5

## 2019-10-03 MED ORDER — PROPOFOL 500 MG/50ML IV EMUL
INTRAVENOUS | Status: DC | PRN
  Administered 2019-10-03: 150 ug/kg/min via INTRAVENOUS
  Administered 2019-10-03: 200 ug/kg/min via INTRAVENOUS

## 2019-10-03 MED ORDER — LACTATED RINGERS IV SOLN
INTRAVENOUS | Status: DC | PRN
  Administered 2019-10-03: 12:00:00 via INTRAVENOUS

## 2019-10-03 MED ORDER — SIMETHICONE 80 MG PO CHEW
80.0000 mg | CHEWABLE_TABLET | Freq: Four times a day (QID) | ORAL | Status: DC | PRN
  Administered 2019-10-03: 80 mg via ORAL
  Filled 2019-10-03 (×2): qty 1

## 2019-10-03 MED ORDER — GLYCOPYRROLATE PF 0.2 MG/ML IJ SOSY
PREFILLED_SYRINGE | INTRAMUSCULAR | Status: AC
  Filled 2019-10-03: qty 3

## 2019-10-03 MED ORDER — KETAMINE HCL 10 MG/ML IJ SOLN
INTRAMUSCULAR | Status: DC | PRN
  Administered 2019-10-03: 30 mg via INTRAVENOUS

## 2019-10-03 MED ORDER — PROPOFOL 10 MG/ML IV BOLUS
INTRAVENOUS | Status: DC | PRN
  Administered 2019-10-03 (×2): 20 mg via INTRAVENOUS

## 2019-10-03 MED ORDER — ALUM & MAG HYDROXIDE-SIMETH 200-200-20 MG/5ML PO SUSP
30.0000 mL | Freq: Once | ORAL | Status: AC
  Administered 2019-10-03: 30 mL via ORAL
  Filled 2019-10-03: qty 30

## 2019-10-03 MED ORDER — LIDOCAINE 2% (20 MG/ML) 5 ML SYRINGE
INTRAMUSCULAR | Status: AC
  Filled 2019-10-03: qty 10

## 2019-10-03 NOTE — Transfer of Care (Signed)
Immediate Anesthesia Transfer of Care Note  Patient: Shawn Brandt  Procedure(s) Performed: COLONOSCOPY WITH PROPOFOL (N/A ) POLYPECTOMY  Patient Location: PACU  Anesthesia Type:General  Level of Consciousness: awake, alert , oriented and patient cooperative  Airway & Oxygen Therapy: Patient Spontanous Breathing  Post-op Assessment: Report given to RN and Post -op Vital signs reviewed and stable  Post vital signs: Reviewed and stable  Last Vitals:  Vitals Value Taken Time  BP 103/63 10/03/19 1222  Temp    Pulse 85 10/03/19 1225  Resp 23 10/03/19 1225  SpO2 97 % 10/03/19 1225  Vitals shown include unvalidated device data.  Last Pain:  Vitals:   10/03/19 1103  TempSrc: Oral  PainSc: 0-No pain      Patients Stated Pain Goal: 7 ([id number])  Complications: No apparent anesthesia complications

## 2019-10-03 NOTE — Anesthesia Procedure Notes (Signed)
Procedure Name: General with mask airway Performed by: Aric Jost A, CRNA Pre-anesthesia Checklist: Timeout performed, Patient being monitored, Suction available, Emergency Drugs available and Patient identified Patient Re-evaluated:Patient Re-evaluated prior to induction Oxygen Delivery Method: Non-rebreather mask       

## 2019-10-03 NOTE — Discharge Instructions (Signed)
Colonoscopy Discharge Instructions  Read the instructions outlined below and refer to this sheet in the next few weeks. These discharge instructions provide you with general information on caring for yourself after you leave the hospital. Your doctor may also give you specific instructions. While your treatment has been planned according to the most current medical practices available, unavoidable complications occasionally occur. If you have any problems or questions after discharge, call Dr. Gala Romney at [phone]. ACTIVITY  You may resume your regular activity, but move at a slower pace for the next 24 hours.   Take frequent rest periods for the next 24 hours.   Walking will help get rid of the air and reduce the bloated feeling in your belly (abdomen).   No driving for 24 hours (because of the medicine (anesthesia) used during the test).    Do not sign any important legal documents or operate any machinery for 24 hours (because of the anesthesia used during the test).  NUTRITION  Drink plenty of fluids.   You may resume your normal diet as instructed by your doctor.   Begin with a light meal and progress to your normal diet. Heavy or fried foods are harder to digest and may make you feel sick to your stomach (nauseated).   Avoid alcoholic beverages for 24 hours or as instructed.  MEDICATIONS  You may resume your normal medications unless your doctor tells you otherwise.  WHAT YOU CAN EXPECT TODAY  Some feelings of bloating in the abdomen.   Passage of more gas than usual.   Spotting of blood in your stool or on the toilet paper.  IF YOU HAD POLYPS REMOVED DURING THE COLONOSCOPY:  No aspirin products for 7 days or as instructed.   No alcohol for 7 days or as instructed.   Eat a soft diet for the next 24 hours.  FINDING OUT THE RESULTS OF YOUR TEST Not all test results are available during your visit. If your test results are not back during the visit, make an appointment  with your caregiver to find out the results. Do not assume everything is normal if you have not heard from your caregiver or the medical facility. It is important for you to follow up on all of your test results.  SEEK IMMEDIATE MEDICAL ATTENTION IF:  You have more than a spotting of blood in your stool.   Your belly is swollen (abdominal distention).   You are nauseated or vomiting.   You have a temperature over 101.   You have abdominal pain or discomfort that is severe or gets worse throughout the day.    Colon Polyps  Polyps are tissue growths inside the body. Polyps can grow in many places, including the large intestine (colon). A polyp may be a round bump or a mushroom-shaped growth. You could have one polyp or several. Most colon polyps are noncancerous (benign). However, some colon polyps can become cancerous over time. Finding and removing the polyps early can help prevent this. What are the causes? The exact cause of colon polyps is not known. What increases the risk? You are more likely to develop this condition if you:  Have a family history of colon cancer or colon polyps.  Are older than 94 or older than 45 if you are African American.  Have inflammatory bowel disease, such as ulcerative colitis or Crohn's disease.  Have certain hereditary conditions, such as: ? Familial adenomatous polyposis. ? Lynch syndrome. ? Turcot syndrome. ? Peutz-Jeghers syndrome.  Are  overweight.  Smoke cigarettes.  Do not get enough exercise.  Drink too much alcohol.  Eat a diet that is high in fat and red meat and low in fiber.  Had childhood cancer that was treated with abdominal radiation. What are the signs or symptoms? Most polyps do not cause symptoms. If you have symptoms, they may include:  Blood coming from your rectum when having a bowel movement.  Blood in your stool. The stool may look dark red or black.  Abdominal pain.  A change in bowel habits, such as  constipation or diarrhea. How is this diagnosed? This condition is diagnosed with a colonoscopy. This is a procedure in which a lighted, flexible scope is inserted into the anus and then passed into the colon to examine the area. Polyps are sometimes found when a colonoscopy is done as part of routine cancer screening tests. How is this treated? Treatment for this condition involves removing any polyps that are found. Most polyps can be removed during a colonoscopy. Those polyps will then be tested for cancer. Additional treatment may be needed depending on the results of testing. Follow these instructions at home: Lifestyle  Maintain a healthy weight, or lose weight if recommended by your health care provider.  Exercise every day or as told by your health care provider.  Do not use any products that contain nicotine or tobacco, such as cigarettes and e-cigarettes. If you need help quitting, ask your health care provider.  If you drink alcohol, limit how much you have: ? 0-1 drink a day for women. ? 0-2 drinks a day for men.  Be aware of how much alcohol is in your drink. In the [place] one drink equals one 12 oz bottle of beer (355 mL), one 5 oz glass of wine (148 mL), or one 1 oz shot of hard liquor (44 mL). Eating and drinking   Eat foods that are high in fiber, such as fruits, vegetables, and whole grains.  Eat foods that are high in calcium and vitamin D, such as milk, cheese, yogurt, eggs, liver, fish, and broccoli.  Limit foods that are high in fat, such as fried foods and desserts.  Limit the amount of red meat and processed meat you eat, such as hot dogs, sausage, bacon, and lunch meats. General instructions  Keep all follow-up visits as told by your health care provider. This is important. ? This includes having regularly scheduled colonoscopies. ? Talk to your health care provider about when you need a colonoscopy. Contact a health care provider if:  You have new or  worsening bleeding during a bowel movement.  You have new or increased blood in your stool.  You have a change in bowel habits.  You lose weight for no known reason. Summary  Polyps are tissue growths inside the body. Polyps can grow in many places, including the colon.  Most colon polyps are noncancerous (benign), but some can become cancerous over time.  This condition is diagnosed with a colonoscopy.  Treatment for this condition involves removing any polyps that are found. Most polyps can be removed during a colonoscopy. This information is not intended to replace advice given to you by your health care provider. Make sure you discuss any questions you have with your health care provider. Document Released: 07/06/2004 Document Revised: 01/25/2018 Document Reviewed: 01/25/2018 Elsevier Patient Education  [place].   Colon polyp and diverticulosis information provided  Further recommendations to follow pending review of pathology report  At  patient's request, I called Colletta Maryland, wife, at [phone] -got message.  Voicemail not set up  Begin Protonix 40 mg daily for reflux.  Prescription provided  Office visit with [place] 3 months with Roseanne Kaufman on March 10 th at [facility]

## 2019-10-03 NOTE — Anesthesia Postprocedure Evaluation (Signed)
Anesthesia Post Note  Patient: Shawn Brandt  Procedure(s) Performed: COLONOSCOPY WITH PROPOFOL (N/A ) POLYPECTOMY  Patient location during evaluation: PACU Anesthesia Type: General Level of consciousness: awake and alert, oriented and patient cooperative Pain management: pain level controlled Vital Signs Assessment: post-procedure vital signs reviewed and stable Respiratory status: spontaneous breathing Cardiovascular status: stable Postop Assessment: no apparent nausea or vomiting Anesthetic complications: no     Last Vitals:  Vitals:   10/03/19 1300 10/03/19 1301  BP: 121/85   Pulse: 62 63  Resp: 13 20  Temp:    SpO2: 100% 98%    Last Pain:  Vitals:   10/03/19 1301  TempSrc:   PainSc: 2                  Anatole Apollo A

## 2019-10-03 NOTE — Op Note (Addendum)
[facility] Patient Name: Shawn Brandt Procedure Date: 10/03/2019 12:01 PM MRN: [id number] Date of Birth: 11-05-1970 Attending MD: Norvel Richards , MD CSN: [id number] Age: 48 Admit Type: Outpatient Procedure:                Colonoscopy Indications:              High risk colon cancer surveillance: Personal                            history of colonic polyps Providers:                Norvel Richards, MD, Janeece Riggers, RN, Aram Candela Referring MD:              Medicines:                Propofol per Anesthesia Complications:            No immediate complications. Estimated Blood Loss:     Estimated blood loss was minimal. Procedure:                Pre-Anesthesia Assessment:                           - Prior to the procedure, a History and Physical                            was performed, and patient medications and                            allergies were reviewed. The patient's tolerance of                            previous anesthesia was also reviewed. The risks                            and benefits of the procedure and the sedation                            options and risks were discussed with the patient.                            All questions were answered, and informed consent                            was obtained. Prior Anticoagulants: The patient has                            taken no previous anticoagulant or antiplatelet                            agents. ASA Grade Assessment: II - A patient with  mild systemic disease. After reviewing the risks                            and benefits, the patient was deemed in                            satisfactory condition to undergo the procedure.                           After obtaining informed consent, the colonoscope                            was passed under direct vision. Throughout the                            procedure, the patient's blood  pressure, pulse, and                            oxygen saturations were monitored continuously. The                            [id number]) scope was introduced through                            the anus and advanced to the the cecum, identified                            by appendiceal orifice and ileocecal valve. The                            colonoscopy was performed without difficulty. The                            patient tolerated the procedure well. The quality                            of the bowel preparation was adequate. Scope In: 12:04:45 PM Scope Out: 12:17:40 PM Scope Withdrawal Time: 0 hours 8 minutes 33 seconds  Total Procedure Duration: 0 hours 12 minutes 55 seconds  Findings:      The perianal and digital rectal examinations were normal.      A few medium-mouthed diverticula were found in the entire colon.      A 6 mm polyp was found in the splenic flexure. The polyp was sessile.       The polyp was removed with a cold snare. Resection and retrieval were       complete. Estimated blood loss was minimal.      The exam was otherwise without abnormality on direct and retroflexion       views. Impression:               - Diverticulosis in the entire examined colon.                           - One 6 mm polyp at the splenic flexure, removed  with a cold snare. Resected and retrieved.                           - The examination was otherwise normal on direct                            and retroflexion views. Moderate Sedation:      Moderate (conscious) sedation was personally administered by an       anesthesia professional. The following parameters were monitored: oxygen       saturation, heart rate, blood pressure, respiratory rate, EKG, adequacy       of pulmonary ventilation, and response to care. Recommendation:           - Patient has a contact number available for                            emergencies. The signs and symptoms of  potential                            delayed complications were discussed with the                            patient. Return to normal activities tomorrow.                            Written discharge instructions were provided to the                            patient.                           - Resume previous diet.                           - Continue present medications.                           - Repeat colonoscopy date to be determined after                            pending pathology results are reviewed for                            surveillance.                           - Return to GI office (date not yet determined).                           Addendum. Patient had some upper abdominal pain in                            PACU. I was called to see him. A few minutes later                            when  I came to see him pain went from a 4-2. States                            he has had it before. Also relates significant                            reflux symptoms and some intermittent dysphagia. As                            I talked to him it almost completely went away his                            exam was benign. Do not feel he has any                            complications from the procedure. He was given                            Mylanta and simethicone in PACU. We will start him                            on Protonix 40 mg daily. He should take it every                            day. We will follow-up with him in the office.                            Appointment to be scheduled. Procedure Code(s):        --- Professional ---                           [phone], Colonoscopy, flexible; with removal of                            tumor(s), polyp(s), or other lesion(s) by snare                            technique Diagnosis Code(s):        --- Professional ---                           Z86.010, Personal history of colonic polyps                           K63.5, Polyp of  colon                           K57.30, Diverticulosis of large intestine without                            perforation or abscess without bleeding CPT copyright 2019 American Medical Association. All rights reserved. The codes documented in this report are preliminary and upon coder review may  be revised to meet current  compliance requirements. Cristopher Estimable. Brazen Domangue, MD Norvel Richards, MD 10/03/2019 12:25:14 PM This report has been signed electronically. Number of Addenda: 0

## 2019-10-03 NOTE — H&P (Signed)
@LOGO @   Primary Care Physician:  Tobe Sos, MD Primary Gastroenterologist:  Dr.   Pre-Procedure History & Physical: HPI:  Shawn Brandt is a 48 y.o. male here for   Past Medical History:  Diagnosis Date  . Asthma    since age 14  . Complication of anesthesia   . GERD (gastroesophageal reflux disease)    mild erosive esophagitis   . Pneumonia 2014  . PONV (postoperative nausea and vomiting)   . RMSF [facility] spotted fever) 2008  . Sciatica   . Tubular adenoma of colon     Past Surgical History:  Procedure Laterality Date  . CHOLECYSTECTOMY  08/2012  . COLONOSCOPY  08/09/12   Hedda Crumbley-cecal tubular adenoma. next tcs 07/2017  . ESOPHAGOGASTRODUODENOSCOPY  08/09/12   Oneill Bais-mild erosive reflux esophagitis    Prior to Admission medications   Medication Sig Start Date End Date Taking? Authorizing Provider  atorvastatin (LIPITOR) 40 MG tablet Take 40 mg by mouth daily.  05/08/19  Yes [provider]  ibuprofen (ADVIL) 600 MG tablet Take 600 mg by mouth every 8 (eight) hours as needed for moderate pain.  02/22/19  Yes [provider]  pseudoephedrine (SUDAFED 12 HOUR) 120 MG 12 hr tablet Take 120 mg by mouth every 12 (twelve) hours as needed for congestion.    Yes [provider]  azelastine (ASTELIN) 0.1 % nasal spray Place 2 sprays into both nostrils daily as needed for rhinitis or allergies. Use in each nostril as directed     [provider]    Allergies as of 05/22/2019 - Review Complete 05/22/2019  Allergen Reaction Noted  . Peanuts [peanut oil]  07/24/2012  . Penicillins  07/24/2012  . Wheat bran Rash 08/09/2012    Family History  Problem Relation Age of Onset  . Cholelithiasis Mother   . Breast cancer Mother   . Diverticulitis Mother   . Cancer Maternal Grandfather        ?  . Prostate cancer Father   . Lung cancer Father   . Pancreatic cancer Other        maternal great aunt  . Colon cancer Neg Hx      Social History   Socioeconomic History  . Marital status: Married    Spouse name: Not on file  . Number of children: 2  . Years of education: Not on file  . Highest education level: Not on file  Occupational History  . Occupation: court system  Tobacco Use  . Smoking status: Former Smoker    Packs/day: 0.50    Years: 6.00    Pack years: 3.00    Types: Cigarettes  . Smokeless tobacco: Current User    Types: Snuff  . Tobacco comment: currently dips  Substance and Sexual Activity  . Alcohol use: Yes    Comment: very seldom  . Drug use: No  . Sexual activity: Not on file  Other Topics Concern  . Not on file  Social History Narrative  . Not on file   Social Determinants of Health   Financial Resource Strain:   . Difficulty of Paying Living Expenses: Not on file  Food Insecurity:   . Worried About Charity fundraiser in the Last Year: Not on file  . Ran Out of Food in the Last Year: Not on file  Transportation Needs:   . Lack of Transportation (Medical): Not on file  . Lack of Transportation (Non-Medical): Not on file  Physical Activity:   .  Days of Exercise per Week: Not on file  . Minutes of Exercise per Session: Not on file  Stress:   . Feeling of Stress : Not on file  Social Connections:   . Frequency of Communication with Friends and Family: Not on file  . Frequency of Social Gatherings with Friends and Family: Not on file  . Attends Religious Services: Not on file  . Active Member of Clubs or Organizations: Not on file  . Attends Archivist Meetings: Not on file  . Marital Status: Not on file  Intimate Partner Violence:   . Fear of Current or Ex-Partner: Not on file  . Emotionally Abused: Not on file  . Physically Abused: Not on file  . Sexually Abused: Not on file    Review of Systems: See HPI, otherwise negative ROS  Physical Exam: BP 138/89   Pulse 93   Temp 98.7 F (37.1 C) (Oral)   Resp 13   Ht 6\' 2"  (1.88 m)   SpO2 97%   BMI  36.10 kg/m  General:   Alert,  Well-developed, well-nourished, pleasant and cooperative in NAD Neck:  Supple; no masses or thyromegaly. No significant cervical adenopathy. Lungs:  Clear throughout to auscultation.   No wheezes, crackles, or rhonchi. No acute distress. Heart:  Regular rate and rhythm; no murmurs, clicks, rubs,  or gallops. Abdomen: Non-distended, normal bowel sounds.  Soft and nontender without appreciable mass or hepatosplenomegaly.  Pulses:  Normal pulses noted. Extremities:  Without clubbing or edema.  Impression/Plan: 48 year old gentleman with a history colonic adenoma-here for surveillance colonoscopy per plan.  The risks, benefits, limitations, alternatives and imponderables have been reviewed with the patient. Questions have been answered. All parties are agreeable.      Notice: This dictation was prepared with Dragon dictation along with smaller phrase technology. Any transcriptional errors that result from this process are unintentional and may not be corrected upon review.

## 2019-10-03 NOTE — Anesthesia Preprocedure Evaluation (Signed)
Anesthesia Evaluation  Patient identified by MRN, date of birth, ID band Patient awake    Reviewed: Allergy & Precautions, NPO status , Patient's Chart, lab work & pertinent test results  History of Anesthesia Complications (+) PONV and history of anesthetic complications  Airway Mallampati: II  TM Distance: >3 FB Neck ROM: Full    Dental no notable dental hx. (+) Dental Advisory Given   Pulmonary asthma , pneumonia, resolved, former smoker,    Pulmonary exam normal breath sounds clear to auscultation       Cardiovascular Exercise Tolerance: Good Normal cardiovascular exam Rhythm:Regular Rate:Normal     Neuro/Psych  Neuromuscular disease    GI/Hepatic Neg liver ROS, GERD  Medicated and Controlled,  Endo/Other  negative endocrine ROS  Renal/GU negative Renal ROS     Musculoskeletal   Abdominal   Peds  Hematology negative hematology ROS (+)   Anesthesia Other Findings   Reproductive/Obstetrics                             Anesthesia Physical Anesthesia Plan  ASA: II  Anesthesia Plan: General   Post-op Pain Management:    Induction: Intravenous  PONV Risk Score and Plan: TIVA  Airway Management Planned: Nasal Cannula, Natural Airway and Simple Face Mask  Additional Equipment:   Intra-op Plan:   Post-operative Plan:   Informed Consent: I have reviewed the patients History and Physical, chart, labs and discussed the procedure including the risks, benefits and alternatives for the proposed anesthesia with the patient or authorized representative who has indicated his/her understanding and acceptance.     Dental advisory given  Plan Discussed with: CRNA  Anesthesia Plan Comments:         Anesthesia Quick Evaluation

## 2019-10-04 ENCOUNTER — Encounter: Payer: Self-pay | Admitting: Internal Medicine

## 2019-10-04 LAB — SURGICAL PATHOLOGY

## 2019-12-30 NOTE — Progress Notes (Deleted)
History of erosive reflux esophagitis, colonoscopy Dec 2020 due to history of adenomas with pancolonic diverticulosis, one 6 mm polyp at splenic flexure  (tubular adenoma). Surveillance in 5 years

## 2020-01-01 ENCOUNTER — Ambulatory Visit: Payer: BC Managed Care – PPO | Admitting: Gastroenterology

## 2020-01-16 ENCOUNTER — Ambulatory Visit: Payer: BC Managed Care – PPO | Admitting: Gastroenterology

## 2022-03-19 ENCOUNTER — Ambulatory Visit
Admission: EM | Admit: 2022-03-19 | Discharge: 2022-03-19 | Disposition: A | Discharge status: Home / Self Care | Payer: BC Managed Care – PPO

## 2022-03-19 DIAGNOSIS — R509 Fever, unspecified: Secondary | ICD-10-CM | POA: Diagnosis not present

## 2022-03-19 DIAGNOSIS — J0100 Acute maxillary sinusitis, unspecified: Secondary | ICD-10-CM | POA: Diagnosis not present

## 2022-03-19 MED ORDER — PROMETHAZINE-DM 6.25-15 MG/5ML PO SYRP: 5.0000 mL | ORAL_SOLUTION | Freq: Four times a day (QID) | ORAL | 0 refills | Status: AC | PRN

## 2022-03-19 MED ORDER — DOXYCYCLINE HYCLATE 100 MG PO CAPS: 100.0000 mg | ORAL_CAPSULE | Freq: Two times a day (BID) | ORAL | 0 refills | Status: AC

## 2022-03-19 NOTE — Discharge Instructions (Addendum)
Take medication as prescribed. As discussed, continue taking your azelastine nasal spray to help with nasal congestion. Increase fluids and allow for plenty of rest. May take ibuprofen or Tylenol for pain, fever, or general discomfort. Recommend using a humidifier at bedtime and during sleep, also recommend sleeping elevated on 2 pillows to help with drainage. Follow up if your symptoms worsen or do not improve.

## 2022-03-19 NOTE — ED Provider Notes (Signed)
[facility]    CSN: [id number] Arrival date & time: 03/19/22  [id number]      History   Chief Complaint No chief complaint on file.   HPI Shawn Brandt is a 51 y.o. male.   HPI Patient presents for upper respiratory symptoms for 1 week.  Symptoms include fever, nasal congestion, cough, postnasal drainage, facial pain, sinus pressure, and fatigue.  Patient states that he has been taking Sudafed, Mucinex, and Robitussin for his symptoms.  Patient states he has also been taking azelastine for his nasal symptoms.  Patient states his symptoms have rapidly worsened for the past 2 to 3 days.  Patient states he has taken for COVID test at home, all of which have been negative. Past Medical History:  Diagnosis Date   Asthma    since age 47   Complication of anesthesia    GERD (gastroesophageal reflux disease)    mild erosive esophagitis    Pneumonia 2014   PONV (postoperative nausea and vomiting)    RMSF [facility] spotted fever) 2008   Sciatica    Tubular adenoma of colon     Patient Active Problem List   Diagnosis Date Noted   LLQ pain 05/22/2019   LUQ pain 05/16/2013   Tubular adenoma of colon 08/15/2012   Neck pain 08/15/2012   Diarrhea 07/24/2012   Abdominal pain 07/24/2012   GERD (gastroesophageal reflux disease) 07/24/2012    Past Surgical History:  Procedure Laterality Date   CHOLECYSTECTOMY  08/2012   COLONOSCOPY  08/09/12   Rourk-cecal tubular adenoma. next tcs 07/2017   COLONOSCOPY WITH PROPOFOL N/A 10/03/2019   Procedure: COLONOSCOPY WITH PROPOFOL;  Surgeon: Daneil Dolin, MD;  Location: [facility];  Service: Endoscopy;  Laterality: N/A;  7:30am-rescheduled to 12/10 @ 11:15am per office   ESOPHAGOGASTRODUODENOSCOPY  08/09/12   Rourk-mild erosive reflux esophagitis   POLYPECTOMY  10/03/2019   Procedure: POLYPECTOMY;  Surgeon: Daneil Dolin, MD;  Location: [facility];  Service: Endoscopy;;       Home Medications    Prior to  Admission medications   Medication Sig Start Date End Date Taking? Authorizing Provider  Dextromethorphan Polistirex (ROBITUSSIN 12 HOUR COUGH PO) Take by mouth.   Yes [provider]  dextromethorphan-guaiFENesin (MUCINEX DM) 30-600 MG 12hr tablet Take 1 tablet by mouth 2 (two) times daily.   Yes [provider]  doxycycline (VIBRAMYCIN) 100 MG capsule Take 1 capsule (100 mg total) by mouth 2 (two) times daily. 03/19/22  Yes Froylan Hobby-Warren, Alda Lea, NP  promethazine-dextromethorphan (PROMETHAZINE-DM) 6.25-15 MG/5ML syrup Take 5 mLs by mouth 4 (four) times daily as needed for cough. 03/19/22  Yes Shaia Porath-Warren, Alda Lea, NP  atorvastatin (LIPITOR) 40 MG tablet Take 40 mg by mouth daily.  05/08/19   [provider]  azelastine (ASTELIN) 0.1 % nasal spray Place 2 sprays into both nostrils daily as needed for rhinitis or allergies. Use in each nostril as directed     [provider]  ibuprofen (ADVIL) 600 MG tablet Take 600 mg by mouth every 8 (eight) hours as needed for moderate pain.  02/22/19   [provider]  pseudoephedrine (SUDAFED 12 HOUR) 120 MG 12 hr tablet Take 120 mg by mouth every 12 (twelve) hours as needed for congestion.     [provider]    Family History Family History  Problem Relation Age of Onset   Cholelithiasis Mother    Breast cancer Mother    Diverticulitis Mother  Cancer Maternal Grandfather        ?   Prostate cancer Father    Lung cancer Father    Pancreatic cancer Other        maternal great aunt   Colon cancer Neg Hx     Social History Social History   Tobacco Use   Smoking status: Former    Packs/day: 0.50    Years: 6.00    Pack years: 3.00    Types: Cigarettes   Smokeless tobacco: Current    Types: Snuff   Tobacco comments:    currently dips  Substance Use Topics   Alcohol use: Yes    Comment: very seldom   Drug use: No     Allergies   Peanut (diagnostic), Peanuts [peanut oil],  Penicillins, and Wheat bran   Review of Systems Review of Systems PER HPI  Physical Exam Triage Vital Signs ED Triage Vitals  Enc Vitals Group     BP 03/19/22 0854 136/90     Pulse Rate 03/19/22 0854 94     Resp 03/19/22 0854 18     Temp 03/19/22 0854 98.9 F (37.2 C)     Temp Source 03/19/22 0854 Oral     SpO2 03/19/22 0854 95 %     Weight --      Height --      Head Circumference --      Peak Flow --      Pain Score 03/19/22 0852 0     Pain Loc --      Pain Edu? --      Excl. in [place]? --    No data found.  Updated Vital Signs BP 136/90 (BP Location: Right Arm)   Pulse 94   Temp 98.9 F (37.2 C) (Oral)   Resp 18   SpO2 95%   Visual Acuity Right Eye Distance:   Left Eye Distance:   Bilateral Distance:    Right Eye Near:   Left Eye Near:    Bilateral Near:     Physical Exam Vitals and nursing note reviewed.  Constitutional:      General: He is not in acute distress.    Appearance: Normal appearance.  HENT:     Head: Normocephalic.     Right Ear: Tympanic membrane, ear canal and external ear normal.     Left Ear: Ear canal and external ear normal. There is impacted cerumen.     Nose: Congestion present.     Right Turbinates: Enlarged and swollen.     Left Turbinates: Enlarged and swollen.     Right Sinus: Maxillary sinus tenderness present. No frontal sinus tenderness.     Left Sinus: Maxillary sinus tenderness present. No frontal sinus tenderness.     Mouth/Throat:     Mouth: Mucous membranes are moist.  Eyes:     Extraocular Movements: Extraocular movements intact.     Conjunctiva/sclera: Conjunctivae normal.     Pupils: Pupils are equal, round, and reactive to light.  Cardiovascular:     Rate and Rhythm: Normal rate and regular rhythm.     Pulses: Normal pulses.     Heart sounds: Normal heart sounds.  Pulmonary:     Effort: Pulmonary effort is normal.     Breath sounds: Normal breath sounds.  Abdominal:     General: Bowel sounds are normal.      Palpations: Abdomen is soft.  Musculoskeletal:     Cervical back: Normal range of motion.  Lymphadenopathy:  Cervical: No cervical adenopathy.  Skin:    General: Skin is warm and dry.     Capillary Refill: Capillary refill takes less than 2 seconds.  Neurological:     General: No focal deficit present.     Mental Status: He is alert and oriented to person, place, and time.  Psychiatric:        Mood and Affect: Mood normal.        Behavior: Behavior normal.     UC Treatments / Results  Labs (all labs ordered are listed, but only abnormal results are displayed) Labs Reviewed - No data to display   EKG   Radiology No results found.  Procedures Procedures (including critical care time)  Medications Ordered in UC Medications - No data to display  Initial Impression / Assessment and Plan / UC Course  I have reviewed the triage vital signs and the nursing notes.  Pertinent labs & imaging results that were available during my care of the patient were reviewed by me and considered in my medical decision making (see chart for details).  We will treat patient empirically with doxycycline.  His vital signs are stable, he is in no acute distress, but we will treat patient with antibiotic based on his worsening of symptoms, sinus pain and pressure, and complaints of tooth pain.  Patient has also been taking over-the-counter medications with no relief.  Symptoms have been present for 1 week.  Supportive care was recommended.  Patient advised to follow-up if symptoms worsen or do not improve. Final Clinical Impressions(s) / UC Diagnoses   Final diagnoses:  Fever, unspecified  Acute maxillary sinusitis, recurrence not specified     Discharge Instructions      Take medication as prescribed. As discussed, continue taking your azelastine nasal spray to help with nasal congestion. Increase fluids and allow for plenty of rest. May take ibuprofen or Tylenol for pain, fever, or  general discomfort. Recommend using a humidifier at bedtime and during sleep, also recommend sleeping elevated on 2 pillows to help with drainage. Follow up if your symptoms worsen or do not improve.     ED Prescriptions     Medication Sig Dispense Auth. Provider   doxycycline (VIBRAMYCIN) 100 MG capsule Take 1 capsule (100 mg total) by mouth 2 (two) times daily. 20 capsule Generoso Cropper-Warren, Alda Lea, NP   promethazine-dextromethorphan (PROMETHAZINE-DM) 6.25-15 MG/5ML syrup Take 5 mLs by mouth 4 (four) times daily as needed for cough. 140 mL Clent Damore-Warren, Alda Lea, NP      PDMP not reviewed this encounter.   Tish Men, NP 03/19/22 [phone]

## 2022-03-19 NOTE — ED Triage Notes (Signed)
Pt reports fever x 2 days; cough, chest congestion, sinus pressure, drainage, ear stuffy and chills x 1 week. Sudafed, Mucinex and Robitussin gives some relief.

## 2024-01-11 ENCOUNTER — Encounter: Payer: Self-pay | Admitting: *Deleted

## 2024-08-28 ENCOUNTER — Encounter (INDEPENDENT_AMBULATORY_CARE_PROVIDER_SITE_OTHER): Payer: Self-pay | Admitting: *Deleted
